# Patient Record
Sex: Male | Born: 1960 | Race: White | Hispanic: No | Marital: Married | Smoking: Former smoker
Health system: Southern US, Community
[De-identification: ages and names within clinical notes are randomized; demographics above are authoritative.]

## PROBLEM LIST (undated history)

## (undated) DIAGNOSIS — F32A Depression, unspecified: Secondary | ICD-10-CM

## (undated) DIAGNOSIS — F329 Major depressive disorder, single episode, unspecified: Secondary | ICD-10-CM

## (undated) HISTORY — DX: Depression, unspecified: F32.A

## (undated) HISTORY — PX: NASAL SEPTUM SURGERY: SHX37

## (undated) HISTORY — DX: Major depressive disorder, single episode, unspecified: F32.9

## (undated) HISTORY — PX: BACK SURGERY: SHX140

## (undated) HISTORY — PX: TONSILLECTOMY: SUR1361

---

## 2006-06-28 ENCOUNTER — Other Ambulatory Visit: Payer: Self-pay

## 2006-06-28 ENCOUNTER — Emergency Department: Payer: Self-pay | Admitting: Emergency Medicine

## 2009-04-08 ENCOUNTER — Ambulatory Visit: Payer: Self-pay | Admitting: Family Medicine

## 2009-06-12 ENCOUNTER — Ambulatory Visit: Payer: Self-pay | Admitting: Family Medicine

## 2010-03-02 ENCOUNTER — Emergency Department: Payer: Self-pay | Admitting: Emergency Medicine

## 2011-12-21 ENCOUNTER — Ambulatory Visit: Payer: Self-pay | Admitting: Emergency Medicine

## 2013-12-19 ENCOUNTER — Ambulatory Visit: Payer: Self-pay | Admitting: Gastroenterology

## 2014-01-24 ENCOUNTER — Ambulatory Visit: Payer: Self-pay | Admitting: Physician Assistant

## 2014-05-25 HISTORY — PX: COLONOSCOPY: SHX174

## 2015-03-28 ENCOUNTER — Other Ambulatory Visit: Payer: Self-pay

## 2015-04-25 ENCOUNTER — Encounter: Payer: Self-pay | Admitting: Surgery

## 2015-04-25 NOTE — Telephone Encounter (Signed)
error 

## 2015-04-30 ENCOUNTER — Ambulatory Visit (INDEPENDENT_AMBULATORY_CARE_PROVIDER_SITE_OTHER): Payer: 59 | Admitting: Family Medicine

## 2015-04-30 ENCOUNTER — Ambulatory Visit
Admission: RE | Admit: 2015-04-30 | Discharge: 2015-04-30 | Disposition: A | Discharge status: Home / Self Care | Payer: 59 | Source: Ambulatory Visit | Attending: Family Medicine | Admitting: Family Medicine

## 2015-04-30 ENCOUNTER — Encounter: Payer: Self-pay | Admitting: Family Medicine

## 2015-04-30 VITALS — BP 100/82 | HR 80

## 2015-04-30 DIAGNOSIS — M5137 Other intervertebral disc degeneration, lumbosacral region: Secondary | ICD-10-CM | POA: Diagnosis not present

## 2015-04-30 DIAGNOSIS — F3341 Major depressive disorder, recurrent, in partial remission: Secondary | ICD-10-CM | POA: Diagnosis not present

## 2015-04-30 DIAGNOSIS — M545 Low back pain: Secondary | ICD-10-CM | POA: Diagnosis present

## 2015-04-30 MED ORDER — ESCITALOPRAM OXALATE 20 MG PO TABS: 20.0000 mg | ORAL_TABLET | Freq: Every day | ORAL | Status: DC

## 2015-04-30 MED ORDER — PREDNISONE 10 MG PO TABS: 10.0000 mg | ORAL_TABLET | Freq: Every day | ORAL | Status: DC

## 2015-04-30 MED ORDER — HYDROCODONE-ACETAMINOPHEN 10-325 MG PO TABS: 1.0000 | ORAL_TABLET | Freq: Three times a day (TID) | ORAL | Status: DC | PRN

## 2015-04-30 NOTE — Progress Notes (Signed)
Name: Derek Blake   MRN: [id number]    DOB: [birth year]   Date:04/30/2015       Progress Note  Subjective  Chief Complaint  Chief Complaint  Patient presents with  . Depression    needs refill on Lexapro  . Back Pain    nauseated from pain- in center of back radiating down both sides of buttocks    Depression      The patient presents with depression.  This is a chronic problem.  The current episode started more than 1 year ago.   The onset quality is gradual.   The problem occurs intermittently.  The problem has been gradually improving since onset.  Associated symptoms include no decreased concentration, no fatigue, no helplessness, no hopelessness, does not have insomnia, not irritable, no restlessness, no decreased interest, no appetite change, no body aches, no myalgias, no headaches, no indigestion, not sad and no suicidal ideas.     The symptoms are aggravated by nothing.  Past treatments include SSRIs - Selective serotonin reuptake inhibitors.  Compliance with treatment is variable.  Previous treatment provided moderate relief.  Past medical history includes depression.     Pertinent negatives include no chronic fatigue syndrome, no chronic pain, no chronic illness and no obsessive-compulsive disorder. Back Pain This is a new problem. The current episode started 1 to 4 weeks ago (3 wks). The problem occurs daily. The problem has been gradually worsening since onset. Pertinent negatives include no abdominal pain, chest pain, dysuria, fever, headaches, tingling or weight loss. He has tried NSAIDs for the symptoms. The treatment provided mild relief.    No problem-specific assessment & plan notes found for this encounter.   Past Medical History  Diagnosis Date  . Depression     Past Surgical History  Procedure Laterality Date  . Nasal septum surgery    . Back surgery    . Tonsillectomy    . Colonoscopy  2016    cleared for 10 yrs- Dr Bluford Kaufmannh    Family History  Problem  Relation Age of Onset  . Cancer Mother     Social History   Social History  . Marital Status: Married    Spouse Name: N/A  . Number of Children: N/A  . Years of Education: N/A   Occupational History  . Not on file.   Social History Main Topics  . Smoking status: Former Games developermoker  . Smokeless tobacco: Not on file  . Alcohol Use: 0.0 oz/week    0 Standard drinks or equivalent per week  . Drug Use: No  . Sexual Activity: Yes   Other Topics Concern  . Not on file   Social History Narrative  . No narrative on file    No Known Allergies   Review of Systems  Constitutional: Negative for fever, chills, weight loss, malaise/fatigue, appetite change and fatigue.  HENT: Negative for ear discharge, ear pain and sore throat.   Eyes: Negative for blurred vision.  Respiratory: Negative for cough, sputum production, shortness of breath and wheezing.   Cardiovascular: Negative for chest pain, palpitations and leg swelling.  Gastrointestinal: Negative for heartburn, nausea, abdominal pain, diarrhea, constipation, blood in stool and melena.  Genitourinary: Negative for dysuria, urgency, frequency and hematuria.  Musculoskeletal: Positive for back pain. Negative for myalgias, joint pain and neck pain.  Skin: Negative for rash.  Neurological: Negative for dizziness, tingling, sensory change, focal weakness and headaches.  Endo/Heme/Allergies: Negative for environmental allergies and polydipsia. Does not bruise/bleed easily.  Psychiatric/Behavioral: Positive for depression. Negative for suicidal ideas and decreased concentration. The patient is not nervous/anxious and does not have insomnia.      Objective  Filed Vitals:   04/30/15 1518  BP: 100/82  Pulse: 80    Physical Exam  Constitutional: He is oriented to person, place, and time and well-developed, well-nourished, and in no distress. He is not irritable.  HENT:  Head: Normocephalic.  Right Ear: External ear normal.  Left  Ear: External ear normal.  Nose: Nose normal.  Mouth/Throat: Oropharynx is clear and moist.  Eyes: Conjunctivae and EOM are normal. Pupils are equal, round, and reactive to light. Right eye exhibits no discharge. Left eye exhibits no discharge. No scleral icterus.  Neck: Normal range of motion. Neck supple. No JVD present. No tracheal deviation present. No thyromegaly present.  Cardiovascular: Normal rate, regular rhythm, normal heart sounds and intact distal pulses.  Exam reveals no gallop and no friction rub.   No murmur heard. Pulmonary/Chest: Breath sounds normal. No respiratory distress. He has no wheezes. He has no rales.  Abdominal: Soft. Bowel sounds are normal. He exhibits no mass. There is no hepatosplenomegaly. There is no tenderness. There is no rebound, no guarding and no CVA tenderness.  Musculoskeletal: Normal range of motion. He exhibits no edema or tenderness.  Lymphadenopathy:    He has no cervical adenopathy.  Neurological: He is alert and oriented to person, place, and time. He has normal motor skills, normal sensation, normal strength, normal reflexes and intact cranial nerves. No cranial nerve deficit. He has a normal Straight Leg Raise Test.  Skin: Skin is warm. No rash noted.  Psychiatric: Mood and affect normal.  Nursing note and vitals reviewed.     Assessment & Plan  Problem List Items Addressed This Visit    None    Visit Diagnoses    Recurrent major depressive disorder, in partial remission (HCC)    -  Primary    Relevant Medications    escitalopram (LEXAPRO) 20 MG tablet    Disc degeneration, lumbosacral        Relevant Medications    predniSONE (DELTASONE) 10 MG tablet    HYDROcodone-acetaminophen (NORCO) 10-325 MG tablet    Other Relevant Orders    DG Lumbar Spine Complete         Dr. Hayden [facility]  04/30/2015

## 2015-04-30 NOTE — Patient Instructions (Signed)
Low Back Pain: Brief Version   What is low back pain? How does it happen?  Low back pain is pain or stiffness in the lower back. Most of the time, it is caused when a muscle in your back is strained. For example, it can happen when you lift a heavy object or when you sit or stand for a long time. Health problems, such as arthritis, can also cause back pain. Low back pain may last a day or two, several weeks, or longer. You may have pain in one spot or it may spread down the buttocks and into your legs. You should see your health care provider right away if you have back pain with these symptoms:   You cannot control your bladder or bowels.  You have a hard time moving your legs or walking.  Your arms or legs are numb or tingling.  These symptoms may mean you have hurt your spine and nerves. When you see your health care provider, he or she will:   Ask about your symptoms.  Give you an exam. X-rays or other tests may also be done.  How is it treated?  Here are some good ideas for taking care of low back pain:   Use an electric heating pad on a low setting (or a hot water bottle wrapped in a towel) for 20 to 30 minutes. (Don't let the heating pad get too hot, and don't fall asleep with it. You could get a burn.)  Use an ice pack wrapped in a towel for 20 minutes, one to four times a day. (Don't leave it on too long. You could get frostbite. Set an alarm to remind you.)  Take aspirin, ibuprofen, or other pain medicines your health care provider may suggest.  Ask about exercises you can do to stretch and strengthen your back muscles.  When you sleep or lie down, keep these hints in mind:   Rest on a firm mattress. It may help to lie on your back with your knees raised or lie on your side with your knees bent.  Put a pillow under your knees when you are lying down.  Sleep without a pillow under your head. Talk to your health care provider about whether it would help to:  Wear a  belt or corset to support your back.  Make visits to a physical therapist for traction.  Have your back massaged by a trained person. Take it easy at first. As you start to feel better, you'll be able to do more and more. But be careful. You may need to cut back on what you do:  If your symptoms come back.  If you have more pain after you start doing more or something new.  See your health care provider if your pain is worse even with treatment. How can I take care of myself?  You can lower the strain on your back. Here are some ideas that can help:   Try to get to and keep a healthy weight.  Use good posture. Stand with your head up, shoulders straight, chest forward, your weight on both feet, and your pelvis tucked in.  Sit in a straight-backed chair and hold your spine against the back of the chair.  Sit close to the pedals when you drive. Use your seat belt and a hard backrest or pillow.  Use a footrest for one foot when you stand or sit in one spot for a long time. This keeps your back straight.    Bend your knees when you bend over.  Here are tips when you need to lift or move heavy objects:   Don't push with your arms when you move a heavy object. Turn around and push backwards so your legs take the strain.  Bend your knees and hips and keep your back straight when you lift a heavy object.  Don't lift heavy objects higher than your waist.  Hold packages you carry close to your body, with your arms bent. To rest your back, do these exercises for 5 minutes or longer:  Lie on your back, bend your knees, and put pillows under your knees.  Lie on your back, put a pillow under your neck, bend your knees to a 90- degree angle, and put your lower legs and feet on a chair.  Lie on your back, bend your knees, and bring one knee up to your chest and hold it there. Repeat with the other knee, then bring both knees to your chest. When holding your knee to your chest, grab your  thigh rather than your lower leg.  

## 2015-05-21 ENCOUNTER — Encounter: Payer: Self-pay | Admitting: Emergency Medicine

## 2015-05-21 ENCOUNTER — Ambulatory Visit (INDEPENDENT_AMBULATORY_CARE_PROVIDER_SITE_OTHER): Payer: 59 | Admitting: Family Medicine

## 2015-05-21 ENCOUNTER — Encounter: Payer: Self-pay | Admitting: Family Medicine

## 2015-05-21 ENCOUNTER — Emergency Department
Admission: EM | Admit: 2015-05-21 | Discharge: 2015-05-21 | Disposition: A | Discharge status: Home / Self Care | Payer: 59 | Attending: Emergency Medicine | Admitting: Emergency Medicine

## 2015-05-21 VITALS — BP 130/80 | HR 80 | Ht 70.0 in | Wt 206.0 lb

## 2015-05-21 DIAGNOSIS — M5126 Other intervertebral disc displacement, lumbar region: Secondary | ICD-10-CM | POA: Diagnosis not present

## 2015-05-21 DIAGNOSIS — M5416 Radiculopathy, lumbar region: Secondary | ICD-10-CM | POA: Diagnosis not present

## 2015-05-21 DIAGNOSIS — M545 Low back pain: Secondary | ICD-10-CM | POA: Diagnosis present

## 2015-05-21 DIAGNOSIS — Z79899 Other long term (current) drug therapy: Secondary | ICD-10-CM | POA: Diagnosis not present

## 2015-05-21 DIAGNOSIS — Z87891 Personal history of nicotine dependence: Secondary | ICD-10-CM | POA: Diagnosis not present

## 2015-05-21 DIAGNOSIS — R110 Nausea: Secondary | ICD-10-CM | POA: Diagnosis not present

## 2015-05-21 DIAGNOSIS — Z7952 Long term (current) use of systemic steroids: Secondary | ICD-10-CM | POA: Insufficient documentation

## 2015-05-21 MED ORDER — HYDROMORPHONE HCL 1 MG/ML IJ SOLN
1.0000 mg | Freq: Once | INTRAMUSCULAR | Status: AC
  Administered 2015-05-21: 1 mg via INTRAMUSCULAR
  Filled 2015-05-21: qty 1

## 2015-05-21 MED ORDER — DEXAMETHASONE SODIUM PHOSPHATE 10 MG/ML IJ SOLN
10.0000 mg | Freq: Once | INTRAMUSCULAR | Status: AC
  Administered 2015-05-21: 10 mg via INTRAMUSCULAR
  Filled 2015-05-21: qty 1

## 2015-05-21 MED ORDER — METHOCARBAMOL 500 MG PO TABS
1000.0000 mg | ORAL_TABLET | Freq: Once | ORAL | Status: AC
  Administered 2015-05-21: 1000 mg via ORAL
  Filled 2015-05-21: qty 2

## 2015-05-21 MED ORDER — OXYCODONE-ACETAMINOPHEN 10-325 MG PO TABS: 1.0000 | ORAL_TABLET | Freq: Four times a day (QID) | ORAL | Status: DC | PRN

## 2015-05-21 MED ORDER — PROMETHAZINE HCL 25 MG PO TABS: 25.0000 mg | ORAL_TABLET | Freq: Three times a day (TID) | ORAL | Status: DC | PRN

## 2015-05-21 MED ORDER — METHOCARBAMOL 750 MG PO TABS: 1500.0000 mg | ORAL_TABLET | Freq: Four times a day (QID) | ORAL | Status: DC

## 2015-05-21 NOTE — ED Notes (Signed)
Pt discharged home after verbalizing understanding of discharge instructions; nad noted. 

## 2015-05-21 NOTE — ED Notes (Signed)
Pt reports back pain that has not been getting better. Dr. Yetta BarreJones (PCP) states that she is out of options for treatment and sent him here. Pt has numbness/pain in right leg and his foot "flops" when he tries to move it. He was treated with prednisone and pain medication x 2 weeks, and it has gotten steadily worse. Pt reports pain is so bad it makes him nauseated.

## 2015-05-21 NOTE — ED Notes (Signed)
Lower back pain since Nov. Recently placed on prednsone  conts to have   Pain radiates into right leg no loss of bowel or bladder

## 2015-05-21 NOTE — Discharge Instructions (Signed)
Radicular Pain °Radicular pain in either the arm or leg is usually from a bulging or herniated disk in the spine. A piece of the herniated disk may press against the nerves as the nerves exit the spine. This causes pain which is felt at the tips of the nerves down the arm or leg. Other causes of radicular pain may include: °· Fractures. °· Heart disease. °· Cancer. °· An abnormal and usually degenerative state of the nervous system or nerves (neuropathy). °Diagnosis may require CT or MRI scanning to determine the primary cause.  °Nerves that start at the neck (nerve roots) may cause radicular pain in the outer shoulder and arm. It can spread down to the thumb and fingers. The symptoms vary depending on which nerve root has been affected. In most cases radicular pain improves with conservative treatment. Neck problems may require physical therapy, a neck collar, or cervical traction. Treatment may take many weeks, and surgery may be considered if the symptoms do not improve.  °Conservative treatment is also recommended for sciatica. Sciatica causes pain to radiate from the lower back or buttock area down the leg into the foot. Often there is a history of back problems. Most patients with sciatica are better after 2 to 4 weeks of rest and other supportive care. Short term bed rest can reduce the disk pressure considerably. Sitting, however, is not a good position since this increases the pressure on the disk. You should avoid bending, lifting, and all other activities which make the problem worse. Traction can be used in severe cases. Surgery is usually reserved for patients who do not improve within the first months of treatment. °Only take over-the-counter or prescription medicines for pain, discomfort, or fever as directed by your caregiver. Narcotics and muscle relaxants may help by relieving more severe pain and spasm and by providing mild sedation. Cold or massage can give significant relief. Spinal manipulation  is not recommended. It can increase the degree of disc protrusion. Epidural steroid injections are often effective treatment for radicular pain. These injections deliver medicine to the spinal nerve in the space between the protective covering of the spinal cord and back bones (vertebrae). Your caregiver can give you more information about steroid injections. These injections are most effective when given within two weeks of the onset of pain.  °You should see your caregiver for follow up care as recommended. A program for neck and back injury rehabilitation with stretching and strengthening exercises is an important part of management.  °SEEK IMMEDIATE MEDICAL CARE IF: °· You develop increased pain, weakness, or numbness in your arm or leg. °· You develop difficulty with bladder or bowel control. °· You develop abdominal pain. °  °This information is not intended to replace advice given to you by your health care provider. Make sure you discuss any questions you have with your health care provider. °  °Document Released: 06/18/2004 Document Revised: 06/01/2014 Document Reviewed: 12/05/2014 °Elsevier Interactive Patient Education ©2016 [employer] ° °

## 2015-05-21 NOTE — ED Provider Notes (Signed)
[facility] Emergency Department Provider Note ____________________________________________  Time seen: Approximately 11:45 AM  I have reviewed the triage vital signs and the nursing notes.   HISTORY  Chief Complaint Back Pain    HPI Derek Blake is a 54 y.o. male patient complain of right radicular back pain for greater than a month. Patient state he is followed by his family doctor for the past month and was told there is no other options that he can treat and was sent to the emergency room for MRI. Patient complaining any numbness and intermittent pain to his right leg with occasional foot "Flop". Patient recently completed a course of prednisone and pain medications last night. Patient went to his family doctor today and was sent to the emergency room. Patient denies any bladder or bowel dysfunction. Patient rates his pain as 8/10.   Past Medical History  Diagnosis Date  . Depression     There are no active problems to display for this patient.   Past Surgical History  Procedure Laterality Date  . Nasal septum surgery    . Back surgery    . Tonsillectomy    . Colonoscopy  2016    cleared for 10 yrs- Dr Bluford Kaufmann    Current Outpatient Rx  Name  Route  Sig  Dispense  Refill  . escitalopram (LEXAPRO) 20 MG tablet   Oral   Take 1 tablet (20 mg total) by mouth daily.   30 tablet   6   . HYDROcodone-acetaminophen (NORCO) 10-325 MG tablet   Oral   Take 1 tablet by mouth every 8 (eight) hours as needed. Patient not taking: Reported on 05/21/2015   30 tablet   0   . methocarbamol (ROBAXIN-750) 750 MG tablet   Oral   Take 2 tablets (1,500 mg total) by mouth 4 (four) times daily.   40 tablet   0   . oxyCODONE-acetaminophen (PERCOCET) 10-325 MG tablet   Oral   Take 1 tablet by mouth every 6 (six) hours as needed for pain.   20 tablet   0   . predniSONE (DELTASONE) 10 MG tablet   Oral   Take 1 tablet (10 mg total) by mouth daily with  breakfast. Patient not taking: Reported on 05/21/2015   14 tablet   0   . promethazine (PHENERGAN) 25 MG tablet   Oral   Take 1 tablet (25 mg total) by mouth every 8 (eight) hours as needed for nausea or vomiting.   20 tablet   0     Allergies Review of patient's allergies indicates no known allergies.  Family History  Problem Relation Age of Onset  . Cancer Mother     Social History Social History  Substance Use Topics  . Smoking status: Former Games developer  . Smokeless tobacco: None  . Alcohol Use: 0.0 oz/week    0 Standard drinks or equivalent per week    Review of Systems Constitutional: No fever/chills Eyes: No visual changes. ENT: No sore throat. Cardiovascular: Denies chest pain. Respiratory: Denies shortness of breath. Gastrointestinal: No abdominal pain.  No nausea, no vomiting.  No diarrhea.  No constipation. Genitourinary: Negative for dysuria. Musculoskeletal: Negative for back pain. Skin: Negative for rash. Neurological: Negative for headaches, focal weakness or numbness. 10-point ROS otherwise negative.  ____________________________________________   PHYSICAL EXAM:  VITAL SIGNS: ED Triage Vitals  Enc Vitals Group     BP 05/21/15 1112 125/74 mmHg     Pulse Rate 05/21/15 1112 85  Resp 05/21/15 1112 20     Temp 05/21/15 1112 98.4 F (36.9 C)     Temp Source 05/21/15 1112 Oral     SpO2 05/21/15 1112 99 %     Weight 05/21/15 1112 206 lb (93.441 kg)     Height 05/21/15 1112 5\' 10"  (1.778 m)     Head Cir --      Peak Flow --      Pain Score 05/21/15 1111 8     Pain Loc --      Pain Edu? --      Excl. in GC? --     Constitutional: Alert and oriented. Well appearing and in no acute distress. Eyes: Conjunctivae are normal. PERRL. EOMI. Head: Atraumatic. Nose: No congestion/rhinnorhea. Mouth/Throat: Mucous membranes are moist.  Oropharynx non-erythematous. Neck: No stridor.  No cervical spine tenderness to  palpation. Hematological/Lymphatic/Immunilogical: No cervical lymphadenopathy. Cardiovascular: Normal rate, regular rhythm. Grossly normal heart sounds.  Good peripheral circulation. Respiratory: Normal respiratory effort.  No retractions. Lungs CTAB. Gastrointestinal: Soft and nontender. No distention. No abdominal bruits. No CVA tenderness. Musculoskeletal: The patient ambulate for atypical gait favoring the right lower extremity. Examination of the back shows no obvious deformity. There is a surgical scar consistent patient past medical history. Patient has decreased range of motion's all fields. Moderate muscle spasms on the right side with extension. Patient had negative straight leg test bilaterally.  Neurologic:  Normal speech and language. No gross focal neurologic deficits are appreciated. No gait instability. Skin:  Skin is warm, dry and intact. No rash noted. Psychiatric: Mood and affect are normal. Speech and behavior are normal.  ____________________________________________   LABS (all labs ordered are listed, but only abnormal results are displayed)  Labs Reviewed - No data to display ____________________________________________  EKG   ____________________________________________  RADIOLOGY  Reviewed previous x-ray that was taken on 05/01/2015. Findings were shown very mild edematous change and felt this height loss centered at L3 and 4 and L4 and 5 there is also a facet joint degenerative changes at L4 and 5 and L5-S1. ____________________________________________   PROCEDURES  Procedure(s) performed: None  Critical Care performed: No  ____________________________________________   INITIAL IMPRESSION / ASSESSMENT AND PLAN / ED COURSE  Pertinent labs & imaging results that were available during my care of the patient were reviewed by me and considered in my medical decision making (see chart for details).  Radicular right back pain. Patient given discharge care  instructions. Advised patient to contact pain management clinic for definitive evaluation and treatment. ____________________________________________   FINAL CLINICAL IMPRESSION(S) / ED DIAGNOSES  Final diagnoses:  Acute radicular low back pain      Joni ReiningRonald K Twylah Bennetts, PA-C 05/21/15 1155  Emily FilbertJonathan E Williams, MD 05/21/15 1351

## 2015-05-21 NOTE — Progress Notes (Signed)
Name: Derek Blake   MRN: [id number]    DOB: 02/20/1961   Date:05/21/2015       Progress Note  Subjective  Chief Complaint  Chief Complaint  Patient presents with  . Back Pain    pain has gotten worse    Back Pain This is a recurrent problem. The current episode started 1 to 4 weeks ago. The problem occurs intermittently. The problem has been gradually worsening since onset. The pain is present in the lumbar spine. The quality of the pain is described as aching. Associated symptoms include leg pain, numbness, paresthesias, tingling and weakness. Pertinent negatives include no abdominal pain, bladder incontinence, bowel incontinence, chest pain, dysuria, fever, headaches, paresis, pelvic pain, perianal numbness or weight loss. Risk factors: previous. He has tried analgesics for the symptoms. The treatment provided no relief.    No problem-specific assessment & plan notes found for this encounter.   Past Medical History  Diagnosis Date  . Depression     Past Surgical History  Procedure Laterality Date  . Nasal septum surgery    . Back surgery    . Tonsillectomy    . Colonoscopy  2016    cleared for 10 yrs- Dr Bluford Kaufmann    Family History  Problem Relation Age of Onset  . Cancer Mother     Social History   Social History  . Marital Status: Married    Spouse Name: N/A  . Number of Children: N/A  . Years of Education: N/A   Occupational History  . Not on file.   Social History Main Topics  . Smoking status: Former Games developer  . Smokeless tobacco: Not on file  . Alcohol Use: 0.0 oz/week    0 Standard drinks or equivalent per week  . Drug Use: No  . Sexual Activity: Yes   Other Topics Concern  . Not on file   Social History Narrative    No Known Allergies   Review of Systems  Constitutional: Negative for fever, chills, weight loss and malaise/fatigue.  HENT: Negative for ear discharge, ear pain and sore throat.   Eyes: Negative for blurred vision.  Respiratory:  Negative for cough, sputum production, shortness of breath and wheezing.   Cardiovascular: Negative for chest pain, palpitations and leg swelling.  Gastrointestinal: Positive for nausea. Negative for heartburn, abdominal pain, diarrhea, constipation, blood in stool, melena and bowel incontinence.  Genitourinary: Negative for bladder incontinence, dysuria, urgency, frequency, hematuria and pelvic pain.  Musculoskeletal: Positive for back pain. Negative for myalgias, joint pain and neck pain.  Skin: Negative for rash.  Neurological: Positive for tingling, weakness, numbness and paresthesias. Negative for dizziness, sensory change, focal weakness and headaches.  Endo/Heme/Allergies: Negative for environmental allergies and polydipsia. Does not bruise/bleed easily.  Psychiatric/Behavioral: Negative for depression and suicidal ideas. The patient is not nervous/anxious and does not have insomnia.      Objective  Filed Vitals:   05/21/15 0819  BP: 130/80  Pulse: 80  Height:  (1.778 m)  Weight: 206 lb (93.441 kg)    Physical Exam  Constitutional: He is oriented to person, place, and time and well-developed, well-nourished, and in no distress.  HENT:  Head: Normocephalic.  Right Ear: External ear normal.  Left Ear: External ear normal.  Nose: Nose normal.  Mouth/Throat: Oropharynx is clear and moist.  Eyes: Conjunctivae and EOM are normal. Pupils are equal, round, and reactive to light. Right eye exhibits no discharge. Left eye exhibits no discharge. No scleral icterus.  Neck:  Normal range of motion. Neck supple. No JVD present. No tracheal deviation present. No thyromegaly present.  Cardiovascular: Normal rate, regular rhythm, normal heart sounds and intact distal pulses.  Exam reveals no gallop and no friction rub.   No murmur heard. Pulmonary/Chest: Breath sounds normal. No respiratory distress. He has no wheezes. He has no rales.  Abdominal: Soft. Bowel sounds are normal. He  exhibits no mass. There is no hepatosplenomegaly. There is no tenderness. There is no rebound, no guarding and no CVA tenderness.  Musculoskeletal: Normal range of motion. He exhibits no edema or tenderness.  Lymphadenopathy:    He has no cervical adenopathy.  Neurological: He is alert and oriented to person, place, and time. He has normal sensation, normal reflexes and intact cranial nerves. He displays weakness. He displays normal reflexes. No cranial nerve deficit. He has an abnormal Straight Leg Raise Test.  Right dorsiflexion 4/5  Skin: Skin is warm. No rash noted.  Psychiatric: Mood and affect normal.      Assessment & Plan  Problem List Items Addressed This Visit    None    Visit Diagnoses    Lumbar herniated disc    -  Primary    [facility] /set up with Dr Clotilde Dietererian    Nausea        Relevant Medications    promethazine (PHENERGAN) 25 MG tablet         Dr. Hayden Rasmusseneanna Hinton Luellen [facility]  05/21/2015

## 2015-05-22 ENCOUNTER — Other Ambulatory Visit: Payer: Self-pay | Admitting: Family Medicine

## 2015-05-31 ENCOUNTER — Other Ambulatory Visit: Payer: Self-pay

## 2015-05-31 DIAGNOSIS — M5137 Other intervertebral disc degeneration, lumbosacral region: Secondary | ICD-10-CM

## 2015-05-31 MED ORDER — HYDROCODONE-ACETAMINOPHEN 10-325 MG PO TABS: 1.0000 | ORAL_TABLET | Freq: Three times a day (TID) | ORAL | Status: DC | PRN

## 2015-06-17 ENCOUNTER — Other Ambulatory Visit: Payer: Self-pay

## 2015-07-04 DIAGNOSIS — M5416 Radiculopathy, lumbar region: Secondary | ICD-10-CM | POA: Insufficient documentation

## 2015-07-12 DIAGNOSIS — M5432 Sciatica, left side: Secondary | ICD-10-CM | POA: Insufficient documentation

## 2015-07-12 DIAGNOSIS — M48062 Spinal stenosis, lumbar region with neurogenic claudication: Secondary | ICD-10-CM | POA: Insufficient documentation

## 2015-07-12 DIAGNOSIS — M5431 Sciatica, right side: Secondary | ICD-10-CM | POA: Insufficient documentation

## 2015-07-12 DIAGNOSIS — Z9889 Other specified postprocedural states: Secondary | ICD-10-CM | POA: Insufficient documentation

## 2015-07-12 DIAGNOSIS — F419 Anxiety disorder, unspecified: Secondary | ICD-10-CM | POA: Insufficient documentation

## 2015-11-29 ENCOUNTER — Ambulatory Visit (INDEPENDENT_AMBULATORY_CARE_PROVIDER_SITE_OTHER): Payer: 59 | Admitting: Family Medicine

## 2015-11-29 ENCOUNTER — Encounter: Payer: Self-pay | Admitting: Family Medicine

## 2015-11-29 VITALS — BP 130/80 | HR 80 | Ht 70.0 in | Wt 203.0 lb

## 2015-11-29 DIAGNOSIS — T148 Other injury of unspecified body region: Secondary | ICD-10-CM | POA: Diagnosis not present

## 2015-11-29 DIAGNOSIS — L03115 Cellulitis of right lower limb: Secondary | ICD-10-CM | POA: Diagnosis not present

## 2015-11-29 DIAGNOSIS — W57XXXA Bitten or stung by nonvenomous insect and other nonvenomous arthropods, initial encounter: Secondary | ICD-10-CM | POA: Diagnosis not present

## 2015-11-29 MED ORDER — SULFAMETHOXAZOLE-TRIMETHOPRIM 800-160 MG PO TABS: 1.0000 | ORAL_TABLET | Freq: Two times a day (BID) | ORAL | Status: DC

## 2015-11-29 MED ORDER — MUPIROCIN 2 % EX OINT: 1.0000 "application " | TOPICAL_OINTMENT | Freq: Two times a day (BID) | CUTANEOUS | Status: DC

## 2015-11-29 NOTE — Progress Notes (Signed)
Name: Derek Blake   MRN: [id number]    DOB: January 27, 1961   Date:11/29/2015       Progress Note  Subjective  Chief Complaint  Chief Complaint  Patient presents with  . Insect Bite    x 2 weeks- was only red but after getting in pool and swimming- looks bumpy, red and hole is bigger    Rash This is a new problem. The current episode started 1 to 4 weeks ago. The problem has been gradually worsening since onset. The affected locations include the right lowerleg. The rash is characterized by redness and swelling. He was exposed to an insect bite/sting. Pertinent negatives include no anorexia, congestion, cough, diarrhea, eye pain, facial edema, fatigue, fever, joint pain, nail changes, rhinorrhea, shortness of breath, sore throat or vomiting. Past treatments include nothing. The treatment provided mild relief.    No problem-specific assessment & plan notes found for this encounter.   Past Medical History  Diagnosis Date  . Depression     Past Surgical History  Procedure Laterality Date  . Nasal septum surgery    . Back surgery    . Tonsillectomy    . Colonoscopy  2016    cleared for 10 yrs- Dr Bluford Kaufmannh    Family History  Problem Relation Age of Onset  . Cancer Mother     Social History   Social History  . Marital Status: Married    Spouse Name: N/A  . Number of Children: N/A  . Years of Education: N/A   Occupational History  . Not on file.   Social History Main Topics  . Smoking status: Former Games developermoker  . Smokeless tobacco: Not on file  . Alcohol Use: 0.0 oz/week    0 Standard drinks or equivalent per week  . Drug Use: No  . Sexual Activity: Yes   Other Topics Concern  . Not on file   Social History Narrative    No Known Allergies   Review of Systems  Constitutional: Negative for fever, chills, weight loss, malaise/fatigue and fatigue.  HENT: Negative for congestion, ear discharge, ear pain, rhinorrhea and sore throat.   Eyes: Negative for blurred vision and  pain.  Respiratory: Negative for cough, sputum production, shortness of breath and wheezing.   Cardiovascular: Negative for chest pain, palpitations and leg swelling.  Gastrointestinal: Negative for heartburn, nausea, vomiting, abdominal pain, diarrhea, constipation, blood in stool, melena and anorexia.  Genitourinary: Negative for dysuria, urgency, frequency and hematuria.  Musculoskeletal: Negative for myalgias, back pain, joint pain and neck pain.  Skin: Positive for rash. Negative for nail changes.  Neurological: Negative for dizziness, tingling, sensory change, focal weakness and headaches.  Endo/Heme/Allergies: Negative for environmental allergies and polydipsia. Does not bruise/bleed easily.  Psychiatric/Behavioral: Negative for depression and suicidal ideas. The patient is not nervous/anxious and does not have insomnia.      Objective  Filed Vitals:   11/29/15 1449  BP: 130/80  Pulse: 80  Height: 5\' 10"  (1.778 m)  Weight: 203 lb (92.08 kg)    Physical Exam  Constitutional: He is oriented to person, place, and time and well-developed, well-nourished, and in no distress.  HENT:  Head: Normocephalic.  Right Ear: External ear normal.  Left Ear: External ear normal.  Nose: Nose normal.  Mouth/Throat: Oropharynx is clear and moist.  Eyes: Conjunctivae and EOM are normal. Pupils are equal, round, and reactive to light. Right eye exhibits no discharge. Left eye exhibits no discharge. No scleral icterus.  Neck: Normal range  of motion. Neck supple. No JVD present. No tracheal deviation present. No thyromegaly present.  Cardiovascular: Normal rate, regular rhythm, normal heart sounds and intact distal pulses.  Exam reveals no gallop and no friction rub.   No murmur heard. Pulmonary/Chest: Breath sounds normal. No respiratory distress. He has no wheezes. He has no rales.  Abdominal: Soft. Bowel sounds are normal. He exhibits no mass. There is no hepatosplenomegaly. There is no  tenderness. There is no rebound, no guarding and no CVA tenderness.  Musculoskeletal: Normal range of motion. He exhibits no edema or tenderness.  Lymphadenopathy:    He has no cervical adenopathy.  Neurological: He is alert and oriented to person, place, and time. He has normal sensation, normal strength, normal reflexes and intact cranial nerves. No cranial nerve deficit.  Skin: Skin is warm. No rash noted.  Psychiatric: Mood and affect normal.  Nursing note and vitals reviewed.     Assessment & Plan  Problem List Items Addressed This Visit    None    Visit Diagnoses    Insect bite    -  Primary    Relevant Medications    mupirocin ointment (BACTROBAN) 2 %    sulfamethoxazole-trimethoprim (BACTRIM DS,SEPTRA DS) 800-160 MG tablet    Cellulitis of right lower extremity        Relevant Medications    mupirocin ointment (BACTROBAN) 2 %    sulfamethoxazole-trimethoprim (BACTRIM DS,SEPTRA DS) 800-160 MG tablet         Dr. Hayden Rasmusseneanna Jones [facility]  11/29/2015

## 2015-12-08 ENCOUNTER — Other Ambulatory Visit: Payer: Self-pay | Admitting: Family Medicine

## 2016-01-29 ENCOUNTER — Other Ambulatory Visit: Payer: Self-pay

## 2016-01-29 MED ORDER — ESCITALOPRAM OXALATE 20 MG PO TABS: 20.0000 mg | ORAL_TABLET | Freq: Every day | ORAL | 0 refills | Status: DC

## 2016-03-02 ENCOUNTER — Encounter: Payer: Self-pay | Admitting: Family Medicine

## 2016-03-02 ENCOUNTER — Ambulatory Visit (INDEPENDENT_AMBULATORY_CARE_PROVIDER_SITE_OTHER): Payer: Managed Care, Other (non HMO) | Admitting: Family Medicine

## 2016-03-02 VITALS — BP 130/80 | HR 80 | Ht 70.0 in | Wt 207.0 lb

## 2016-03-02 DIAGNOSIS — E663 Overweight: Secondary | ICD-10-CM

## 2016-03-02 DIAGNOSIS — F3342 Major depressive disorder, recurrent, in full remission: Secondary | ICD-10-CM

## 2016-03-02 MED ORDER — ESCITALOPRAM OXALATE 20 MG PO TABS: 20.0000 mg | ORAL_TABLET | Freq: Every day | ORAL | 6 refills | Status: DC

## 2016-03-02 NOTE — Progress Notes (Signed)
Name: Derek Blake   MRN: [id number]    DOB: 1961/02/14   Date:03/02/2016       Progress Note  Subjective  Chief Complaint  Chief Complaint  Patient presents with  . Depression    Depression       The patient presents with depression.  This is a recurrent problem.  The current episode started more than 1 year ago.   The onset quality is gradual.   The problem occurs daily.  The problem has been gradually improving since onset.  Associated symptoms include no fatigue, does not have insomnia, not irritable, no decreased interest, no myalgias, no headaches, not sad and no suicidal ideas.     The symptoms are aggravated by nothing.  Past treatments include SSRIs - Selective serotonin reuptake inhibitors.  Compliance with treatment is good.  Previous treatment provided mild relief.  Past medical history includes anxiety and depression.     No problem-specific Assessment & Plan notes found for this encounter.   Past Medical History:  Diagnosis Date  . Depression     Past Surgical History:  Procedure Laterality Date  . BACK SURGERY    . COLONOSCOPY  2016   cleared for 10 yrs- Dr Bluford Kaufmann  . NASAL SEPTUM SURGERY    . TONSILLECTOMY      Family History  Problem Relation Age of Onset  . Cancer Mother     Social History   Social History  . Marital status: Married    Spouse name: N/A  . Number of children: N/A  . Years of education: N/A   Occupational History  . Not on file.   Social History Main Topics  . Smoking status: Former Games developer  . Smokeless tobacco: Not on file  . Alcohol use 0.0 oz/week  . Drug use: No  . Sexual activity: Yes   Other Topics Concern  . Not on file   Social History Narrative  . No narrative on file    No Known Allergies   Review of Systems  Constitutional: Negative for chills, fatigue, fever, malaise/fatigue and weight loss.  HENT: Negative for ear discharge, ear pain and sore throat.   Eyes: Negative for blurred vision.  Respiratory:  Negative for cough, sputum production, shortness of breath and wheezing.   Cardiovascular: Negative for chest pain, palpitations and leg swelling.  Gastrointestinal: Negative for abdominal pain, blood in stool, constipation, diarrhea, heartburn, melena and nausea.  Genitourinary: Negative for dysuria, frequency, hematuria and urgency.  Musculoskeletal: Negative for back pain, joint pain, myalgias and neck pain.  Skin: Negative for rash.  Neurological: Negative for dizziness, tingling, sensory change, focal weakness and headaches.  Endo/Heme/Allergies: Negative for environmental allergies and polydipsia. Does not bruise/bleed easily.  Psychiatric/Behavioral: Positive for depression. Negative for suicidal ideas. The patient is not nervous/anxious and does not have insomnia.      Objective  Vitals:   03/02/16 0816  BP: 130/80  Pulse: 80  Weight: 207 lb (93.9 kg)  Height: 5\' 10"  (1.778 m)    Physical Exam  Constitutional: He is oriented to person, place, and time and well-developed, well-nourished, and in no distress. He is not irritable.  HENT:  Head: Normocephalic.  Right Ear: External ear normal.  Left Ear: External ear normal.  Nose: Nose normal.  Mouth/Throat: Oropharynx is clear and moist.  Eyes: Conjunctivae and EOM are normal. Pupils are equal, round, and reactive to light. Right eye exhibits no discharge. Left eye exhibits no discharge. No scleral icterus.  Neck: Normal  range of motion. Neck supple. No JVD present. No tracheal deviation present. No thyromegaly present.  Cardiovascular: Normal rate, regular rhythm, normal heart sounds and intact distal pulses.  Exam reveals no gallop and no friction rub.   No murmur heard. Pulmonary/Chest: Breath sounds normal. No respiratory distress. He has no wheezes. He has no rales.  Abdominal: Soft. Bowel sounds are normal. He exhibits no mass. There is no hepatosplenomegaly. There is no tenderness. There is no rebound, no guarding and no  CVA tenderness.  Musculoskeletal: Normal range of motion. He exhibits no edema or tenderness.  Lymphadenopathy:    He has no cervical adenopathy.  Neurological: He is alert and oriented to person, place, and time. He has normal sensation, normal strength, normal reflexes and intact cranial nerves. No cranial nerve deficit.  Skin: Skin is warm. No rash noted.  Psychiatric: Mood and affect normal.  Nursing note and vitals reviewed.     Assessment & Plan  Problem List Items Addressed This Visit      Other   Recurrent major depressive disorder, in full remission (HCC) - Primary   Relevant Medications   escitalopram (LEXAPRO) 20 MG tablet    Other Visit Diagnoses    Overweight       Relevant Orders   Lipid panel        Dr. Elizabeth Sauereanna Takaya [facility]  03/02/16

## 2016-03-03 ENCOUNTER — Other Ambulatory Visit: Payer: Self-pay

## 2016-03-03 LAB — LIPID PANEL
CHOLESTEROL TOTAL: 167 mg/dL (ref 100–199)
Chol/HDL Ratio: 5.2 ratio units — ABNORMAL HIGH (ref 0.0–5.0)
HDL: 32 mg/dL — AB (ref >39)
LDL CALC: 65 mg/dL (ref 0–99)
TRIGLYCERIDES: 351 mg/dL — AB (ref 0–149)
VLDL CHOLESTEROL CAL: 70 mg/dL — AB (ref 5–40)

## 2016-03-06 DIAGNOSIS — D62 Acute posthemorrhagic anemia: Secondary | ICD-10-CM | POA: Insufficient documentation

## 2016-06-04 ENCOUNTER — Other Ambulatory Visit: Payer: Self-pay

## 2016-08-19 ENCOUNTER — Ambulatory Visit (INDEPENDENT_AMBULATORY_CARE_PROVIDER_SITE_OTHER): Payer: Managed Care, Other (non HMO) | Admitting: Family Medicine

## 2016-08-19 VITALS — BP 140/80 | HR 84 | Ht 67.0 in | Wt 206.0 lb

## 2016-08-19 DIAGNOSIS — K2970 Gastritis, unspecified, without bleeding: Secondary | ICD-10-CM | POA: Diagnosis not present

## 2016-08-19 MED ORDER — PANTOPRAZOLE SODIUM 40 MG PO TBEC: 40.0000 mg | DELAYED_RELEASE_TABLET | Freq: Every day | ORAL | 3 refills | Status: DC

## 2016-08-19 MED ORDER — PROMETHAZINE HCL 25 MG PO TABS: 25.0000 mg | ORAL_TABLET | Freq: Three times a day (TID) | ORAL | 0 refills | Status: DC | PRN

## 2016-08-19 NOTE — Progress Notes (Signed)
Name: Derek Blake   MRN: [id number]    DOB: 04-04-1961   Date:08/19/2016       Progress Note  Subjective  Chief Complaint  Chief Complaint  Patient presents with  . Nausea    nauseated x 3 days- can't eat and "feel like I can throw up any minute"- pepto bismol helps some. No Hx of ulcers    Emesis   This is a recurrent (nausea) problem. The current episode started in the past 7 days (with occasional vomiting). The problem occurs more than 10 times per day. The problem has been waxing and waning. There has been no fever. Associated symptoms include abdominal pain. Pertinent negatives include no arthralgias, chest pain, chills, coughing, diarrhea, dizziness, fever, headaches, myalgias, sweats, URI or weight loss. He has tried nothing for the symptoms. The treatment provided mild relief.    No problem-specific Assessment & Plan notes found for this encounter.   Past Medical History:  Diagnosis Date  . Depression     Past Surgical History:  Procedure Laterality Date  . BACK SURGERY    . COLONOSCOPY  2016   cleared for 10 yrs- Dr Bluford Kaufmann  . NASAL SEPTUM SURGERY    . TONSILLECTOMY      Family History  Problem Relation Age of Onset  . Cancer Mother     Social History   Social History  . Marital status: Married    Spouse name: N/A  . Number of children: N/A  . Years of education: N/A   Occupational History  . Not on file.   Social History Main Topics  . Smoking status: Former Games developer  . Smokeless tobacco: Not on file  . Alcohol use 0.0 oz/week  . Drug use: No  . Sexual activity: Yes   Other Topics Concern  . Not on file   Social History Narrative  . No narrative on file    No Known Allergies  Outpatient Medications Prior to Visit  Medication Sig Dispense Refill  . escitalopram (LEXAPRO) 20 MG tablet Take 1 tablet (20 mg total) by mouth daily. 30 tablet 6  . mupirocin ointment (BACTROBAN) 2 % Place 1 application into the nose 2 (two) times daily. 22 g 0   No  facility-administered medications prior to visit.     Review of Systems  Constitutional: Negative for chills, fever, malaise/fatigue and weight loss.  HENT: Negative for ear discharge, ear pain and sore throat.   Eyes: Negative for blurred vision.  Respiratory: Negative for cough, sputum production, shortness of breath and wheezing.   Cardiovascular: Negative for chest pain, palpitations and leg swelling.  Gastrointestinal: Positive for abdominal pain and vomiting. Negative for blood in stool, constipation, diarrhea, heartburn, melena and nausea.  Genitourinary: Negative for dysuria, frequency, hematuria and urgency.  Musculoskeletal: Negative for arthralgias, back pain, joint pain, myalgias and neck pain.  Skin: Negative for rash.  Neurological: Negative for dizziness, tingling, sensory change, focal weakness and headaches.  Endo/Heme/Allergies: Negative for environmental allergies and polydipsia. Does not bruise/bleed easily.  Psychiatric/Behavioral: Negative for depression and suicidal ideas. The patient is not nervous/anxious and does not have insomnia.      Objective  Vitals:   08/19/16 0829  BP: 140/80  Pulse: 84  Weight: 206 lb (93.4 kg)  Height: 5\' 7"  (1.702 m)    Physical Exam  Constitutional: He is oriented to person, place, and time and well-developed, well-nourished, and in no distress.  HENT:  Head: Normocephalic.  Right Ear: External ear normal.  Left Ear: External ear normal.  Nose: Nose normal.  Mouth/Throat: Oropharynx is clear and moist.  Eyes: Conjunctivae and EOM are normal. Pupils are equal, round, and reactive to light. Right eye exhibits no discharge. Left eye exhibits no discharge. No scleral icterus.  Neck: Normal range of motion. Neck supple. No JVD present. No tracheal deviation present. No thyromegaly present.  Cardiovascular: Normal rate, regular rhythm, normal heart sounds and intact distal pulses.  Exam reveals no gallop and no friction rub.   No  murmur heard. Pulmonary/Chest: Breath sounds normal. No respiratory distress. He has no wheezes. He has no rales.  Abdominal: Soft. Bowel sounds are normal. He exhibits no mass. There is no hepatosplenomegaly. There is no tenderness. There is no rebound, no guarding and no CVA tenderness.  Genitourinary: Rectum normal. Rectal exam shows guaiac negative stool.  Musculoskeletal: Normal range of motion. He exhibits no edema or tenderness.  Lymphadenopathy:    He has no cervical adenopathy.  Neurological: He is alert and oriented to person, place, and time. He has normal sensation, normal strength and intact cranial nerves. No cranial nerve deficit.  Skin: Skin is warm. No rash noted.  Psychiatric: Mood and affect normal.  Nursing note and vitals reviewed.     Assessment & Plan  Problem List Items Addressed This Visit    None    Visit Diagnoses    Gastritis without bleeding, unspecified chronicity, unspecified gastritis type    -  Primary   Relevant Medications   pantoprazole (PROTONIX) 40 MG tablet   promethazine (PHENERGAN) 25 MG tablet   Other Relevant Orders   H. pylori antibody, IgG      Meds ordered this encounter  Medications  . pantoprazole (PROTONIX) 40 MG tablet    Sig: Take 1 tablet (40 mg total) by mouth daily.    Dispense:  30 tablet    Refill:  3  . promethazine (PHENERGAN) 25 MG tablet    Sig: Take 1 tablet (25 mg total) by mouth every 8 (eight) hours as needed for nausea or vomiting.    Dispense:  20 tablet    Refill:  0      Dr. Elizabeth Sauereanna Goran [facility]  08/19/16

## 2016-08-20 LAB — H. PYLORI ANTIBODY, IGG

## 2016-10-02 ENCOUNTER — Other Ambulatory Visit: Payer: Self-pay

## 2016-10-02 MED ORDER — ESCITALOPRAM OXALATE 20 MG PO TABS: 20.0000 mg | ORAL_TABLET | Freq: Every day | ORAL | 6 refills | Status: DC

## 2016-12-28 ENCOUNTER — Ambulatory Visit (INDEPENDENT_AMBULATORY_CARE_PROVIDER_SITE_OTHER): Payer: Managed Care, Other (non HMO) | Admitting: Family Medicine

## 2016-12-28 ENCOUNTER — Encounter: Payer: Self-pay | Admitting: Family Medicine

## 2016-12-28 VITALS — BP 120/80 | HR 100 | Temp 98.0°F | Ht 67.0 in | Wt 196.0 lb

## 2016-12-28 DIAGNOSIS — F3342 Major depressive disorder, recurrent, in full remission: Secondary | ICD-10-CM | POA: Diagnosis not present

## 2016-12-28 DIAGNOSIS — K2900 Acute gastritis without bleeding: Secondary | ICD-10-CM

## 2016-12-28 DIAGNOSIS — E860 Dehydration: Secondary | ICD-10-CM | POA: Diagnosis not present

## 2016-12-28 MED ORDER — PROMETHAZINE HCL 25 MG PO TABS: 25.0000 mg | ORAL_TABLET | Freq: Three times a day (TID) | ORAL | 0 refills | Status: DC | PRN

## 2016-12-28 NOTE — Patient Instructions (Signed)
Dehydration, Adult Dehydration is a condition in which there is not enough fluid or water in the body. This happens when you lose more fluids than you take in. Important organs, such as the kidneys, brain, and heart, cannot function without a proper amount of fluids. Any loss of fluids from the body can lead to dehydration. Dehydration can range from mild to severe. This condition should be treated right away to prevent it from becoming severe. What are the causes? This condition may be caused by:  Vomiting.  Diarrhea.  Excessive sweating, such as from heat exposure or exercise.  Not drinking enough fluid, especially: ? When ill. ? While doing activity that requires a lot of energy.  Excessive urination.  Fever.  Infection.  Certain medicines, such as medicines that cause the body to lose excess fluid (diuretics).  Inability to access safe drinking water.  Reduced physical ability to get adequate water and food.  What increases the risk? This condition is more likely to develop in people:  Who have a poorly controlled long-term (chronic) illness, such as diabetes, heart disease, or kidney disease.  Who are age 65 or older.  Who are disabled.  Who live in a place with high altitude.  Who play endurance sports.  What are the signs or symptoms? Symptoms of mild dehydration may include:  Thirst.  Dry lips.  Slightly dry mouth.  Dry, warm skin.  Dizziness. Symptoms of moderate dehydration may include:  Very dry mouth.  Muscle cramps.  Dark urine. Urine may be the color of tea.  Decreased urine production.  Decreased tear production.  Heartbeat that is irregular or faster than normal (palpitations).  Headache.  Light-headedness, especially when you stand up from a sitting position.  Fainting (syncope). Symptoms of severe dehydration may include:  Changes in skin, such as: ? Cold and clammy skin. ? Blotchy (mottled) or pale skin. ? Skin that does  not quickly return to normal after being lightly pinched and released (poor skin turgor).  Changes in body fluids, such as: ? Extreme thirst. ? No tear production. ? Inability to sweat when body temperature is high, such as in hot weather. ? Very little urine production.  Changes in vital signs, such as: ? Weak pulse. ? Pulse that is more than 100 beats a minute when sitting still. ? Rapid breathing. ? Low blood pressure.  Other changes, such as: ? Sunken eyes. ? Cold hands and feet. ? Confusion. ? Lack of energy (lethargy). ? Difficulty waking up from sleep. ? Short-term weight loss. ? Unconsciousness. How is this diagnosed? This condition is diagnosed based on your symptoms and a physical exam. Blood and urine tests may be done to help confirm the diagnosis. How is this treated? Treatment for this condition depends on the severity. Mild or moderate dehydration can often be treated at home. Treatment should be started right away. Do not wait until dehydration becomes severe. Severe dehydration is an emergency and it needs to be treated in a hospital. Treatment for mild dehydration may include:  Drinking more fluids.  Replacing salts and minerals in your blood (electrolytes) that you may have lost. Treatment for moderate dehydration may include:  Drinking an oral rehydration solution (ORS). This is a drink that helps you replace fluids and electrolytes (rehydrate). It can be found at pharmacies and retail stores. Treatment for severe dehydration may include:  Receiving fluids through an IV tube.  Receiving an electrolyte solution through a feeding tube that is passed through your nose   and into your stomach (nasogastric tube, or NG tube).  Correcting any abnormalities in electrolytes.  Treating the underlying cause of dehydration. Follow these instructions at home:  If directed by your health care provider, drink an ORS: ? Make an ORS by following instructions on the  package. ? Start by drinking small amounts, about  cup (120 mL) every 5-10 minutes. ? Slowly increase how much you drink until you have taken the amount recommended by your health care provider.  Drink enough clear fluid to keep your urine clear or pale yellow. If you were told to drink an ORS, finish the ORS first, then start slowly drinking other clear fluids. Drink fluids such as: ? Water. Do not drink only water. Doing that can lead to having too little salt (sodium) in the body (hyponatremia). ? Ice chips. ? Fruit juice that you have added water to (diluted fruit juice). ? Low-calorie sports drinks.  Avoid: ? Alcohol. ? Drinks that contain a lot of sugar. These include high-calorie sports drinks, fruit juice that is not diluted, and soda. ? Caffeine. ? Foods that are greasy or contain a lot of fat or sugar.  Take over-the-counter and prescription medicines only as told by your health care provider.  Do not take sodium tablets. This can lead to having too much sodium in the body (hypernatremia).  Eat foods that contain a healthy balance of electrolytes, such as bananas, oranges, potatoes, tomatoes, and spinach.  Keep all follow-up visits as told by your health care provider. This is important. Contact a health care provider if:  You have abdominal pain that: ? Gets worse. ? Stays in one area (localizes).  You have a rash.  You have a stiff neck.  You are more irritable than usual.  You are sleepier or more difficult to wake up than usual.  You feel weak or dizzy.  You feel very thirsty.  You have urinated only a small amount of very dark urine over 6-8 hours. Get help right away if:  You have symptoms of severe dehydration.  You cannot drink fluids without vomiting.  Your symptoms get worse with treatment.  You have a fever.  You have a severe headache.  You have vomiting or diarrhea that: ? Gets worse. ? Does not go away.  You have blood or green matter  (bile) in your vomit.  You have blood in your stool. This may cause stool to look black and tarry.  You have not urinated in 6-8 hours.  You faint.  Your heart rate while sitting still is over 100 beats a minute.  You have trouble breathing. This information is not intended to replace advice given to you by your health care provider. Make sure you discuss any questions you have with your health care provider. Document Released: 05/11/2005 Document Revised: 12/06/2015 Document Reviewed: 07/05/2015 Elsevier Interactive Patient Education  2018 [employer]  

## 2016-12-28 NOTE — Progress Notes (Signed)
Name: Derek Blake   MRN: [id number]    DOB: 03-06-1961   Date:12/28/2016       Progress Note  Subjective  Chief Complaint  Chief Complaint  Patient presents with  . Nausea    no vomitting or diarrhea    Emesis   This is a new problem. The current episode started in the past 7 days. Episode frequency: persistent nausis. The problem has been waxing and waning. There has been no fever. Associated symptoms include dizziness. Pertinent negatives include no abdominal pain, arthralgias, chest pain, chills, coughing, diarrhea, fever, headaches, myalgias, sweats, URI or weight loss. The treatment provided mild relief.    No problem-specific Assessment & Plan notes found for this encounter.   Past Medical History:  Diagnosis Date  . Depression     Past Surgical History:  Procedure Laterality Date  . BACK SURGERY    . COLONOSCOPY  2016   cleared for 10 yrs- Dr Bluford Kaufmannh  . NASAL SEPTUM SURGERY    . TONSILLECTOMY      Family History  Problem Relation Age of Onset  . Cancer Mother     Social History   Social History  . Marital status: Married    Spouse name: N/A  . Number of children: N/A  . Years of education: N/A   Occupational History  . Not on file.   Social History Main Topics  . Smoking status: Former Games developermoker  . Smokeless tobacco: Never Used  . Alcohol use 0.0 oz/week  . Drug use: No  . Sexual activity: Yes   Other Topics Concern  . Not on file   Social History Narrative  . No narrative on file    No Known Allergies  Outpatient Medications Prior to Visit  Medication Sig Dispense Refill  . escitalopram (LEXAPRO) 20 MG tablet Take 1 tablet (20 mg total) by mouth daily. 30 tablet 6  . pantoprazole (PROTONIX) 40 MG tablet Take 1 tablet (40 mg total) by mouth daily. 30 tablet 3  . promethazine (PHENERGAN) 25 MG tablet Take 1 tablet (25 mg total) by mouth every 8 (eight) hours as needed for nausea or vomiting. 20 tablet 0   No facility-administered medications  prior to visit.     Review of Systems  Constitutional: Negative for chills, fever, malaise/fatigue and weight loss.  HENT: Negative for ear discharge, ear pain and sore throat.   Eyes: Negative for blurred vision.  Respiratory: Negative for cough, sputum production, shortness of breath and wheezing.   Cardiovascular: Negative for chest pain, palpitations and leg swelling.  Gastrointestinal: Positive for vomiting. Negative for abdominal pain, blood in stool, constipation, diarrhea, heartburn, melena and nausea.  Genitourinary: Negative for dysuria, frequency, hematuria and urgency.  Musculoskeletal: Negative for arthralgias, back pain, joint pain, myalgias and neck pain.  Skin: Negative for rash.  Neurological: Positive for dizziness. Negative for tingling, sensory change, focal weakness and headaches.  Endo/Heme/Allergies: Negative for environmental allergies and polydipsia. Does not bruise/bleed easily.  Psychiatric/Behavioral: Negative for depression and suicidal ideas. The patient is not nervous/anxious and does not have insomnia.      Objective  Vitals:   12/28/16 1339  BP: 120/80  Pulse: 100  Temp: 98 F (36.7 C)  TempSrc: Oral  Weight: 196 lb (88.9 kg)  Height: 5\' 7"  (1.702 m)    Physical Exam  Constitutional: He is oriented to person, place, and time and well-developed, well-nourished, and in no distress.  HENT:  Head: Normocephalic.  Right Ear: External ear normal.  Left Ear: External ear normal.  Nose: Nose normal.  Mouth/Throat: Oropharynx is clear and moist.  Eyes: Pupils are equal, round, and reactive to light. Conjunctivae and EOM are normal. Right eye exhibits no discharge. Left eye exhibits no discharge. No scleral icterus.  Neck: Normal range of motion. Neck supple. No JVD present. No tracheal deviation present. No thyromegaly present.  Cardiovascular: Normal rate, regular rhythm, normal heart sounds and intact distal pulses.  Exam reveals no gallop and no  friction rub.   No murmur heard. Pulmonary/Chest: Breath sounds normal. No respiratory distress. He has no wheezes. He has no rales.  Abdominal: Soft. Bowel sounds are normal. He exhibits no mass. There is no hepatosplenomegaly. There is no tenderness. There is no rebound, no guarding and no CVA tenderness.  Musculoskeletal: Normal range of motion. He exhibits no edema or tenderness.  Lymphadenopathy:    He has no cervical adenopathy.  Neurological: He is alert and oriented to person, place, and time. He has normal sensation, normal strength, normal reflexes and intact cranial nerves. No cranial nerve deficit.  Skin: Skin is warm. No rash noted.  Psychiatric: Mood and affect normal.  Nursing note and vitals reviewed.     Assessment & Plan  Problem List Items Addressed This Visit      Other   Recurrent major depressive disorder, in full remission (HCC)    Other Visit Diagnoses    Acute gastritis without hemorrhage, unspecified gastritis type    -  Primary   resume protonix   Relevant Medications   promethazine (PHENERGAN) 25 MG tablet   Dehydration          Meds ordered this encounter  Medications  . promethazine (PHENERGAN) 25 MG tablet    Sig: Take 1 tablet (25 mg total) by mouth every 8 (eight) hours as needed for nausea or vomiting.    Dispense:  20 tablet    Refill:  0      Dr. Hayden [facility]  12/28/16

## 2017-04-05 ENCOUNTER — Other Ambulatory Visit: Payer: Self-pay

## 2017-05-07 ENCOUNTER — Other Ambulatory Visit: Payer: Self-pay | Admitting: Family Medicine

## 2017-05-28 ENCOUNTER — Encounter: Payer: Self-pay | Admitting: Family Medicine

## 2017-05-28 ENCOUNTER — Ambulatory Visit: Payer: Managed Care, Other (non HMO) | Admitting: Family Medicine

## 2017-05-28 VITALS — BP 130/80 | HR 80 | Temp 98.3°F | Ht 67.0 in | Wt 203.0 lb

## 2017-05-28 DIAGNOSIS — J40 Bronchitis, not specified as acute or chronic: Secondary | ICD-10-CM | POA: Diagnosis not present

## 2017-05-28 DIAGNOSIS — J0100 Acute maxillary sinusitis, unspecified: Secondary | ICD-10-CM | POA: Diagnosis not present

## 2017-05-28 MED ORDER — GUAIFENESIN-CODEINE 100-10 MG/5ML PO SYRP: 5.0000 mL | ORAL_SOLUTION | Freq: Three times a day (TID) | ORAL | 0 refills | Status: DC | PRN

## 2017-05-28 MED ORDER — AMOXICILLIN-POT CLAVULANATE 875-125 MG PO TABS: 1.0000 | ORAL_TABLET | Freq: Two times a day (BID) | ORAL | 0 refills | Status: DC

## 2017-05-28 NOTE — Progress Notes (Signed)
Name: Derek Blake   MRN: [id number]    DOB: 01-14-1961   Date:05/28/2017       Progress Note  Subjective  Chief Complaint  Chief Complaint  Patient presents with  . Sinusitis    cong, sore throat ( burning)- alkaseltzer plus    Sinusitis  This is a new problem. The current episode started in the past 7 days (2 days). The problem is unchanged. There has been no fever. The pain is moderate (on fire). Associated symptoms include congestion, coughing, sinus pressure, sneezing, a sore throat and swollen glands. Pertinent negatives include no chills, diaphoresis, ear pain, headaches, hoarse voice, neck pain or shortness of breath. Past treatments include oral decongestants. The treatment provided mild relief.  Cough  This is a new problem. The current episode started in the past 7 days. The problem has been gradually worsening. The cough is productive of purulent sputum (yellow). Associated symptoms include a sore throat. Pertinent negatives include no chest pain, chills, ear pain, fever, headaches, heartburn, myalgias, rash, shortness of breath, weight loss or wheezing. There is no history of environmental allergies.    No problem-specific Assessment & Plan notes found for this encounter.   Past Medical History:  Diagnosis Date  . Depression     Past Surgical History:  Procedure Laterality Date  . BACK SURGERY    . COLONOSCOPY  2016   cleared for 10 yrs- Dr Bluford Kaufmann  . NASAL SEPTUM SURGERY    . TONSILLECTOMY      Family History  Problem Relation Age of Onset  . Cancer Mother     Social History   Socioeconomic History  . Marital status: Married    Spouse name: Not on file  . Number of children: Not on file  . Years of education: Not on file  . Highest education level: Not on file  Social Needs  . Financial resource strain: Not on file  . Food insecurity - worry: Not on file  . Food insecurity - inability: Not on file  . Transportation needs - medical: Not on file  .  Transportation needs - non-medical: Not on file  Occupational History  . Not on file  Tobacco Use  . Smoking status: Former Games developer  . Smokeless tobacco: Never Used  Substance and Sexual Activity  . Alcohol use: Yes    Alcohol/week: 0.0 oz  . Drug use: No  . Sexual activity: Yes  Other Topics Concern  . Not on file  Social History Narrative  . Not on file    No Known Allergies  Outpatient Medications Prior to Visit  Medication Sig Dispense Refill  . escitalopram (LEXAPRO) 20 MG tablet TAKE 1 TABLET BY MOUTH ONCE DAILY 30 tablet 1  . pantoprazole (PROTONIX) 40 MG tablet Take 1 tablet (40 mg total) by mouth daily. 30 tablet 3  . promethazine (PHENERGAN) 25 MG tablet Take 1 tablet (25 mg total) by mouth every 8 (eight) hours as needed for nausea or vomiting. 20 tablet 0   No facility-administered medications prior to visit.     Review of Systems  Constitutional: Negative for chills, diaphoresis, fever, malaise/fatigue and weight loss.  HENT: Positive for congestion, sinus pressure, sneezing and sore throat. Negative for ear discharge, ear pain and hoarse voice.   Eyes: Negative for blurred vision.  Respiratory: Positive for cough. Negative for sputum production, shortness of breath and wheezing.   Cardiovascular: Negative for chest pain, palpitations and leg swelling.  Gastrointestinal: Negative for abdominal pain, blood  in stool, constipation, diarrhea, heartburn, melena and nausea.  Genitourinary: Negative for dysuria, frequency, hematuria and urgency.  Musculoskeletal: Negative for back pain, joint pain, myalgias and neck pain.  Skin: Negative for rash.  Neurological: Negative for dizziness, tingling, sensory change, focal weakness and headaches.  Endo/Heme/Allergies: Negative for environmental allergies and polydipsia. Does not bruise/bleed easily.  Psychiatric/Behavioral: Negative for depression and suicidal ideas. The patient is not nervous/anxious and does not have  insomnia.      Objective  Vitals:   05/28/17 0959  BP: 130/80  Pulse: 80  Temp: 98.3 F (36.8 C)  TempSrc: Oral  Weight: 203 lb (92.1 kg)  Height: 5\' 7"  (1.702 m)    Physical Exam  Constitutional: He is oriented to person, place, and time and well-developed, well-nourished, and in no distress.  HENT:  Head: Normocephalic.  Right Ear: External ear normal.  Left Ear: External ear normal.  Nose: Right sinus exhibits maxillary sinus tenderness. Left sinus exhibits maxillary sinus tenderness.  Mouth/Throat: Oropharynx is clear and moist.  Eyes: Conjunctivae and EOM are normal. Pupils are equal, round, and reactive to light. Right eye exhibits no discharge. Left eye exhibits no discharge. No scleral icterus.  Neck: Normal range of motion. Neck supple. No JVD present. No tracheal deviation present. No thyromegaly present.  Cardiovascular: Normal rate, regular rhythm, normal heart sounds and intact distal pulses. Exam reveals no gallop and no friction rub.  No murmur heard. Pulmonary/Chest: Breath sounds normal. No respiratory distress. He has no wheezes. He has no rales.  Abdominal: Soft. Bowel sounds are normal. He exhibits no mass. There is no hepatosplenomegaly. There is no tenderness. There is no rebound, no guarding and no CVA tenderness.  Musculoskeletal: Normal range of motion. He exhibits no edema or tenderness.  Lymphadenopathy:    He has no cervical adenopathy.  Neurological: He is alert and oriented to person, place, and time. He has normal sensation, normal strength, normal reflexes and intact cranial nerves. No cranial nerve deficit.  Skin: Skin is warm. No rash noted.  Psychiatric: Mood and affect normal.  Nursing note and vitals reviewed.     Assessment & Plan  Problem List Items Addressed This Visit    None    Visit Diagnoses    Acute maxillary sinusitis, recurrence not specified    -  Primary   Relevant Medications   amoxicillin-clavulanate (AUGMENTIN)  875-125 MG tablet   guaiFENesin-codeine (ROBITUSSIN AC) 100-10 MG/5ML syrup   Bronchitis       Relevant Medications   amoxicillin-clavulanate (AUGMENTIN) 875-125 MG tablet   guaiFENesin-codeine (ROBITUSSIN AC) 100-10 MG/5ML syrup      Meds ordered this encounter  Medications  . amoxicillin-clavulanate (AUGMENTIN) 875-125 MG tablet    Sig: Take 1 tablet by mouth 2 (two) times daily.    Dispense:  20 tablet    Refill:  0  . guaiFENesin-codeine (ROBITUSSIN AC) 100-10 MG/5ML syrup    Sig: Take 5 mLs by mouth 3 (three) times daily as needed for cough.    Dispense:  100 mL    Refill:  0      Dr. Elizabeth [facility]uereanna [facility]ensel [facility]  05/28/17

## 2017-07-05 ENCOUNTER — Ambulatory Visit: Payer: Managed Care, Other (non HMO) | Admitting: Family Medicine

## 2017-07-05 ENCOUNTER — Encounter: Payer: Self-pay | Admitting: Family Medicine

## 2017-07-05 VITALS — BP 130/80 | HR 80 | Ht 67.0 in | Wt 205.0 lb

## 2017-07-05 DIAGNOSIS — S91341A Puncture wound with foreign body, right foot, initial encounter: Secondary | ICD-10-CM | POA: Diagnosis not present

## 2017-07-05 DIAGNOSIS — Z23 Encounter for immunization: Secondary | ICD-10-CM

## 2017-07-05 MED ORDER — TRAMADOL HCL 50 MG PO TABS: 50.0000 mg | ORAL_TABLET | Freq: Three times a day (TID) | ORAL | 0 refills | Status: DC | PRN

## 2017-07-05 MED ORDER — AMOXICILLIN-POT CLAVULANATE 875-125 MG PO TABS: 1.0000 | ORAL_TABLET | Freq: Two times a day (BID) | ORAL | 0 refills | Status: DC

## 2017-07-05 NOTE — Progress Notes (Signed)
Name: Derek Blake   MRN: [id number]    DOB: Mar 12, 1961   Date:07/05/2017       Progress Note  Subjective  Chief Complaint  Chief Complaint  Patient presents with  . Foot Pain    R) great toe pain- stepped on rusty nail Saturday- nail was under leaves    Foot Pain  This is a new (stepped on nail) problem. The current episode started in the past 7 days. The problem occurs constantly. The problem has been gradually worsening. Associated symptoms include joint swelling. Pertinent negatives include no abdominal pain, anorexia, arthralgias, change in bowel habit, chest pain, chills, coughing, fever, headaches, myalgias, nausea, neck pain, rash, sore throat, swollen glands, urinary symptoms, vertigo, visual change or vomiting. Exacerbated by: puncture wound. Treatments tried: epsom/alcohol. The treatment provided mild relief.    No problem-specific Assessment & Plan notes found for this encounter.   Past Medical History:  Diagnosis Date  . Depression     Past Surgical History:  Procedure Laterality Date  . BACK SURGERY    . COLONOSCOPY  2016   cleared for 10 yrs- Dr Bluford Kaufmann  . NASAL SEPTUM SURGERY    . TONSILLECTOMY      Family History  Problem Relation Age of Onset  . Cancer Mother     Social History   Socioeconomic History  . Marital status: Married    Spouse name: Not on file  . Number of children: Not on file  . Years of education: Not on file  . Highest education level: Not on file  Social Needs  . Financial resource strain: Not on file  . Food insecurity - worry: Not on file  . Food insecurity - inability: Not on file  . Transportation needs - medical: Not on file  . Transportation needs - non-medical: Not on file  Occupational History  . Not on file  Tobacco Use  . Smoking status: Former Games developer  . Smokeless tobacco: Never Used  Substance and Sexual Activity  . Alcohol use: Yes    Alcohol/week: 0.0 oz  . Drug use: No  . Sexual activity: Yes  Other Topics  Concern  . Not on file  Social History Narrative  . Not on file    No Known Allergies  Outpatient Medications Prior to Visit  Medication Sig Dispense Refill  . escitalopram (LEXAPRO) 20 MG tablet TAKE 1 TABLET BY MOUTH ONCE DAILY 30 tablet 1  . pantoprazole (PROTONIX) 40 MG tablet Take 1 tablet (40 mg total) by mouth daily. 30 tablet 3  . amoxicillin-clavulanate (AUGMENTIN) 875-125 MG tablet Take 1 tablet by mouth 2 (two) times daily. 20 tablet 0  . guaiFENesin-codeine (ROBITUSSIN AC) 100-10 MG/5ML syrup Take 5 mLs by mouth 3 (three) times daily as needed for cough. 100 mL 0   No facility-administered medications prior to visit.     Review of Systems  Constitutional: Negative for chills, fever, malaise/fatigue and weight loss.  HENT: Negative for ear discharge, ear pain and sore throat.   Eyes: Negative for blurred vision and pain.  Respiratory: Negative for cough, sputum production, shortness of breath and wheezing.   Cardiovascular: Negative for chest pain, palpitations and leg swelling.  Gastrointestinal: Negative for abdominal pain, anorexia, blood in stool, change in bowel habit, constipation, diarrhea, heartburn, melena, nausea and vomiting.  Genitourinary: Negative for dysuria, frequency, hematuria and urgency.  Musculoskeletal: Positive for joint swelling. Negative for arthralgias, back pain, joint pain, myalgias and neck pain.  Skin: Negative for rash.  Neurological: Negative for dizziness, vertigo, tingling, sensory change, focal weakness and headaches.  Endo/Heme/Allergies: Negative for environmental allergies and polydipsia. Does not bruise/bleed easily.  Psychiatric/Behavioral: Negative for depression and suicidal ideas. The patient is not nervous/anxious and does not have insomnia.      Objective  Vitals:   07/05/17 0819  BP: 130/80  Pulse: 80  Weight: 205 lb (93 kg)  Height: 5\' 7"  (1.702 m)    Physical Exam  Constitutional: He is oriented to person, place,  and time and well-developed, well-nourished, and in no distress.  HENT:  Head: Normocephalic.  Right Ear: External ear normal.  Left Ear: External ear normal.  Nose: Nose normal.  Mouth/Throat: Oropharynx is clear and moist.  Eyes: Conjunctivae and EOM are normal. Pupils are equal, round, and reactive to light. Right eye exhibits no discharge. Left eye exhibits no discharge. No scleral icterus.  Neck: Normal range of motion. Neck supple. No JVD present. No tracheal deviation present. No thyromegaly present.  Cardiovascular: Normal rate, regular rhythm, normal heart sounds and intact distal pulses. Exam reveals no gallop and no friction rub.  No murmur heard. Pulmonary/Chest: Breath sounds normal. No respiratory distress. He has no wheezes. He has no rales.  Abdominal: Soft. Bowel sounds are normal. He exhibits no mass. There is no hepatosplenomegaly. There is no tenderness. There is no rebound, no guarding and no CVA tenderness.  Musculoskeletal: Normal range of motion. He exhibits no edema or tenderness.  Lymphadenopathy:    He has no cervical adenopathy.  Neurological: He is alert and oriented to person, place, and time. He has normal sensation, normal strength, normal reflexes and intact cranial nerves. No cranial nerve deficit.  Skin: Skin is warm. No rash noted.  Psychiatric: Mood and affect normal.  Nursing note and vitals reviewed.     Assessment & Plan  Problem List Items Addressed This Visit    None    Visit Diagnoses    Puncture wound of right foot with foreign body, initial encounter    -  Primary   Relevant Orders   Tdap vaccine greater than or equal to 7yo IM (Completed)      Meds ordered this encounter  Medications  . amoxicillin-clavulanate (AUGMENTIN) 875-125 MG tablet    Sig: Take 1 tablet by mouth 2 (two) times daily.    Dispense:  20 tablet    Refill:  0  . traMADol (ULTRAM) 50 MG tablet    Sig: Take 1 tablet (50 mg total) by mouth every 8 (eight) hours  as needed.    Dispense:  15 tablet    Refill:  0      Dr. Hayden Rasmusseneanna Dawnell [facility]  07/05/17

## 2017-07-09 ENCOUNTER — Other Ambulatory Visit: Payer: Self-pay

## 2017-07-09 MED ORDER — ESCITALOPRAM OXALATE 20 MG PO TABS: 20.0000 mg | ORAL_TABLET | Freq: Every day | ORAL | 0 refills | Status: DC

## 2017-07-20 ENCOUNTER — Ambulatory Visit: Payer: Managed Care, Other (non HMO) | Admitting: Family Medicine

## 2017-07-20 ENCOUNTER — Encounter: Payer: Self-pay | Admitting: Family Medicine

## 2017-07-20 VITALS — BP 130/98 | HR 76 | Ht 70.0 in | Wt 203.0 lb

## 2017-07-20 DIAGNOSIS — K602 Anal fissure, unspecified: Secondary | ICD-10-CM

## 2017-07-20 DIAGNOSIS — K6289 Other specified diseases of anus and rectum: Secondary | ICD-10-CM

## 2017-07-20 DIAGNOSIS — N411 Chronic prostatitis: Secondary | ICD-10-CM

## 2017-07-20 LAB — HEMOCCULT GUIAC POC 1CARD (OFFICE): Fecal Occult Blood, POC: NEGATIVE

## 2017-07-20 MED ORDER — HYDROCORTISONE 2.5 % RE CREA: 1.0000 "application " | TOPICAL_CREAM | Freq: Two times a day (BID) | RECTAL | 0 refills | Status: DC

## 2017-07-20 MED ORDER — TRAMADOL HCL 50 MG PO TABS: 50.0000 mg | ORAL_TABLET | Freq: Three times a day (TID) | ORAL | 0 refills | Status: DC | PRN

## 2017-07-20 MED ORDER — HYOSCYAMINE SULFATE 0.125 MG PO TABS: 0.1250 mg | ORAL_TABLET | ORAL | 0 refills | Status: DC | PRN

## 2017-07-20 MED ORDER — HYDROCORTISONE ACE-PRAMOXINE 1-1 % RE FOAM: 1.0000 | Freq: Two times a day (BID) | RECTAL | 0 refills | Status: DC

## 2017-07-20 MED ORDER — DOXYCYCLINE HYCLATE 100 MG PO TABS: 100.0000 mg | ORAL_TABLET | Freq: Two times a day (BID) | ORAL | 0 refills | Status: DC

## 2017-07-20 NOTE — Addendum Note (Signed)
Addended by: Arthur HolmsLYNCH, Mayanna Garlitz L on: 07/20/2017 02:00 PM   Modules accepted: Orders

## 2017-07-20 NOTE — Progress Notes (Signed)
Name: Derek Blake   MRN: [id number]    DOB: [birth year]   Date:07/20/2017       Progress Note  Subjective  Chief Complaint  No chief complaint on file.   Rectal Bleeding   The current episode started 5 to 7 days ago (rectal pain). The onset was sudden. The problem occurs continuously. The problem has been gradually worsening. The pain is severe. The stool is described as soft. Prior successful therapies include stool softeners. Ineffective treatments: sitz baths. Associated symptoms include rectal pain. Pertinent negatives include no fever, no abdominal pain, no diarrhea, no nausea, no hematuria, no chest pain, no headaches, no coughing and no rash.    No problem-specific Assessment & Plan notes found for this encounter.   Past Medical History:  Diagnosis Date  . Depression     Past Surgical History:  Procedure Laterality Date  . BACK SURGERY    . COLONOSCOPY  2016   cleared for 10 yrs- Dr Bluford Kaufmannh  . NASAL SEPTUM SURGERY    . TONSILLECTOMY      Family History  Problem Relation Age of Onset  . Cancer Mother     Social History   Socioeconomic History  . Marital status: Married    Spouse name: Not on file  . Number of children: Not on file  . Years of education: Not on file  . Highest education level: Not on file  Social Needs  . Financial resource strain: Not on file  . Food insecurity - worry: Not on file  . Food insecurity - inability: Not on file  . Transportation needs - medical: Not on file  . Transportation needs - non-medical: Not on file  Occupational History  . Not on file  Tobacco Use  . Smoking status: Former Games developermoker  . Smokeless tobacco: Never Used  Substance and Sexual Activity  . Alcohol use: Yes    Alcohol/week: 0.0 oz  . Drug use: No  . Sexual activity: Yes  Other Topics Concern  . Not on file  Social History Narrative  . Not on file    No Known Allergies  Outpatient Medications Prior to Visit  Medication Sig Dispense Refill  .  amoxicillin-clavulanate (AUGMENTIN) 875-125 MG tablet Take 1 tablet by mouth 2 (two) times daily. 20 tablet 0  . escitalopram (LEXAPRO) 20 MG tablet Take 1 tablet (20 mg total) by mouth daily. 90 tablet 0  . pantoprazole (PROTONIX) 40 MG tablet Take 1 tablet (40 mg total) by mouth daily. 30 tablet 3  . traMADol (ULTRAM) 50 MG tablet Take 1 tablet (50 mg total) by mouth every 8 (eight) hours as needed. 15 tablet 0   No facility-administered medications prior to visit.     Review of Systems  Constitutional: Negative for chills, fever, malaise/fatigue and weight loss.  HENT: Negative for ear discharge, ear pain and sore throat.   Eyes: Negative for blurred vision.  Respiratory: Negative for cough, sputum production, shortness of breath and wheezing.   Cardiovascular: Negative for chest pain, palpitations and leg swelling.  Gastrointestinal: Positive for hematochezia and rectal pain. Negative for abdominal pain, blood in stool, constipation, diarrhea, heartburn, melena and nausea.  Genitourinary: Negative for dysuria, frequency, hematuria and urgency.  Musculoskeletal: Negative for back pain, joint pain, myalgias and neck pain.  Skin: Negative for rash.  Neurological: Negative for dizziness, tingling, sensory change, focal weakness and headaches.  Endo/Heme/Allergies: Negative for environmental allergies and polydipsia. Does not bruise/bleed easily.  Psychiatric/Behavioral: Negative for depression and suicidal  ideas. The patient is not nervous/anxious and does not have insomnia.      Objective  Vitals:   07/20/17 1126  BP: (!) 130/98  Pulse: 76  Weight: 203 lb (92.1 kg)  Height: 5\' 10"  (1.778 m)    Physical Exam  Constitutional: He is oriented to person, place, and time and well-developed, well-nourished, and in no distress.  HENT:  Head: Normocephalic.  Right Ear: External ear normal.  Left Ear: External ear normal.  Nose: Nose normal.  Mouth/Throat: Oropharynx is clear and  moist.  Eyes: Conjunctivae and EOM are normal. Pupils are equal, round, and reactive to light. Right eye exhibits no discharge. Left eye exhibits no discharge. No scleral icterus.  Neck: Normal range of motion. Neck supple. No JVD present. No tracheal deviation present. No thyromegaly present.  Cardiovascular: Normal rate, regular rhythm, normal heart sounds and intact distal pulses. Exam reveals no gallop and no friction rub.  No murmur heard. Pulmonary/Chest: Breath sounds normal. No respiratory distress. He has no wheezes. He has no rales.  Abdominal: Soft. Bowel sounds are normal. He exhibits no mass. There is no hepatosplenomegaly. There is no tenderness. There is no rebound, no guarding and no CVA tenderness.  Genitourinary: Rectal exam shows internal hemorrhoid and tenderness. Rectal exam shows guaiac negative stool. Prostate is tender. Prostate is not enlarged.  Musculoskeletal: Normal range of motion. He exhibits no edema or tenderness.  Lymphadenopathy:    He has no cervical adenopathy.  Neurological: He is alert and oriented to person, place, and time. He has normal sensation, normal strength, normal reflexes and intact cranial nerves. No cranial nerve deficit.  Skin: Skin is warm. No rash noted.  Psychiatric: Mood and affect normal.  Nursing note and vitals reviewed.     Assessment & Plan  Problem List Items Addressed This Visit    None    Visit Diagnoses    Anal fissure    -  Primary   Relevant Medications   hydrocortisone-pramoxine (PROCTOFOAM-HC) rectal foam   Proctalgia       Relevant Medications   hyoscyamine (LEVSIN, ANASPAZ) 0.125 MG tablet   traMADol (ULTRAM) 50 MG tablet   Other Relevant Orders   POCT occult blood stool (Completed)   Subacute prostatitis       Relevant Medications   doxycycline (VIBRA-TABS) 100 MG tablet      Meds ordered this encounter  Medications  . hyoscyamine (LEVSIN, ANASPAZ) 0.125 MG tablet    Sig: Take 1 tablet (0.125 mg total)  by mouth every 4 (four) hours as needed.    Dispense:  30 tablet    Refill:  0  . doxycycline (VIBRA-TABS) 100 MG tablet    Sig: Take 1 tablet (100 mg total) by mouth 2 (two) times daily.    Dispense:  20 tablet    Refill:  0  . traMADol (ULTRAM) 50 MG tablet    Sig: Take 1 tablet (50 mg total) by mouth every 8 (eight) hours as needed.    Dispense:  15 tablet    Refill:  0  . hydrocortisone-pramoxine (PROCTOFOAM-HC) rectal foam    Sig: Place 1 applicator rectally 2 (two) times daily.    Dispense:  10 g    Refill:  0      Dr. Hayden [facility]  07/20/17

## 2017-07-20 NOTE — Patient Instructions (Signed)
How to Take a Sitz Bath A sitz bath is a warm water bath that is taken while you are sitting down. The water should only come up to your hips and should cover your buttocks. Your health care provider may recommend a sitz bath to help you:  Clean the lower part of your body, including your genital area.  With itching.  With pain.  With sore muscles or muscles that tighten or spasm.  How to take a sitz bath Take 3-4 sitz baths per day or as told by your health care provider. 1. Partially fill a bathtub with warm water. You will only need the water to be deep enough to cover your hips and buttocks when you are sitting in it. 2. If your health care provider told you to put medicine in the water, follow the directions exactly. 3. Sit in the water and open the tub drain a little. 4. Turn on the warm water again to keep the tub at the correct level. Keep the water running constantly. 5. Soak in the water for 15-20 minutes or as told by your health care provider. 6. After the sitz bath, pat the affected area dry first. Do not rub it. 7. Be careful when you stand up after the sitz bath because you may feel dizzy.  Contact a health care provider if:  Your symptoms get worse. Do not continue with sitz baths if your symptoms get worse.  You have new symptoms. Do not continue with sitz baths until you talk with your health care provider. This information is not intended to replace advice given to you by your health care provider. Make sure you discuss any questions you have with your health care provider. Document Released: 02/01/2004 Document Revised: 10/09/2015 Document Reviewed: 05/09/2014 Elsevier Interactive Patient Education  2018 [employer] Anal Fissure, Adult An anal fissure is a small tear or crack in the skin around the anus. Bleeding from a fissure usually stops on its own within a few minutes. However, bleeding will often occur again with each bowel movement until the crack  heals. What are the causes? This condition may be caused by:  Passing large, hard stool (feces).  Frequent diarrhea.  Constipation.  Inflammatory bowel disease (Crohn disease or ulcerative colitis).  Infections.  Anal sex.  What are the signs or symptoms? Symptoms of this condition include:  Bleeding from the rectum.  Small amounts of blood seen on your stool, on toilet paper, or in the toilet after a bowel movement.  Painful bowel movements.  Itching or irritation around the anus.  How is this diagnosed? A health care provider may diagnose this condition by closely examining the anal area. An anal fissure can usually be seen with careful inspection. In some cases, a rectal exam may be performed, or a short tube (anoscope) may be used to examine the anal canal. How is this treated? Treatment for this condition may include:  Taking steps to avoid constipation. This may include making changes to your diet, such as increasing your intake of fiber or fluid.  Taking fiber supplements. These supplements can soften your stool to help make bowel movements easier. Your health care provider may also prescribe a stool softener if your stool is often hard.  Taking sitz baths. This may help to heal the tear.  Using medicated creams or ointments. These may be prescribed to lessen discomfort.  Follow these instructions at home: Eating and drinking  Avoid foods that may be constipating, such as bananas  and dairy products.  Drink enough fluid to keep your urine clear or pale yellow.  Maintain a diet that is high in fruits, whole grains, and vegetables. General instructions  Keep the anal area as clean and dry as possible.  Take sitz baths as told by your health care provider. Do not use soap in the sitz baths.  Take over-the-counter and prescription medicines only as told by your health care provider.  Use creams or ointments only as told by your health care provider.  Keep  all follow-up visits as told by your health care provider. This is important. Contact a health care provider if:  You have more bleeding.  You have a fever.  You have diarrhea that is mixed with blood.  You continue to have pain.  Your problem is getting worse rather than better. This information is not intended to replace advice given to you by your health care provider. Make sure you discuss any questions you have with your health care provider. Document Released: 05/11/2005 Document Revised: 09/18/2015 Document Reviewed: 08/06/2014 Elsevier Interactive Patient Education  [employer].

## 2017-10-12 ENCOUNTER — Other Ambulatory Visit: Payer: Self-pay

## 2017-10-12 MED ORDER — ESCITALOPRAM OXALATE 20 MG PO TABS: 20.0000 mg | ORAL_TABLET | Freq: Every day | ORAL | 0 refills | Status: DC

## 2017-10-23 ENCOUNTER — Ambulatory Visit
Admission: EM | Admit: 2017-10-23 | Discharge: 2017-10-23 | Disposition: A | Discharge status: Home / Self Care | Payer: Managed Care, Other (non HMO)

## 2017-10-23 ENCOUNTER — Encounter: Payer: Self-pay | Admitting: Gynecology

## 2017-10-23 ENCOUNTER — Other Ambulatory Visit: Payer: Self-pay

## 2017-10-23 DIAGNOSIS — M5010 Cervical disc disorder with radiculopathy, unspecified cervical region: Secondary | ICD-10-CM

## 2017-10-23 DIAGNOSIS — M542 Cervicalgia: Secondary | ICD-10-CM

## 2017-10-23 DIAGNOSIS — M5382 Other specified dorsopathies, cervical region: Secondary | ICD-10-CM | POA: Diagnosis not present

## 2017-10-23 MED ORDER — NAPROXEN 500 MG PO TABS: 500.0000 mg | ORAL_TABLET | Freq: Two times a day (BID) | ORAL | 0 refills | Status: DC

## 2017-10-23 MED ORDER — KETOROLAC TROMETHAMINE 60 MG/2ML IM SOLN
60.0000 mg | Freq: Once | INTRAMUSCULAR | Status: AC
  Administered 2017-10-23: 60 mg via INTRAMUSCULAR

## 2017-10-23 MED ORDER — LIDOCAINE HCL (PF) 1 % IJ SOLN
1.0000 mL | Freq: Once | INTRAMUSCULAR | Status: AC
  Administered 2017-10-23: 1 mL via SUBCUTANEOUS

## 2017-10-23 MED ORDER — METAXALONE 800 MG PO TABS: 800.0000 mg | ORAL_TABLET | Freq: Three times a day (TID) | ORAL | 0 refills | Status: DC

## 2017-10-23 MED ORDER — TRIAMCINOLONE ACETONIDE 40 MG/ML IJ SUSP
40.0000 mg | Freq: Once | INTRAMUSCULAR | Status: AC
  Administered 2017-10-23: 40 mg

## 2017-10-23 NOTE — ED Triage Notes (Signed)
Patient c/o left arm pain. Per patient pain started in neck and rotate to his left arm.

## 2017-10-23 NOTE — ED Provider Notes (Signed)
[facility]    CSN: [id number] Arrival date & time: 10/23/17  1358     History   Chief Complaint Chief Complaint  Patient presents with  . Neck Pain  . Shoulder Pain    HPI Derek Blake is a 57 y.o. male.   HPI  57 year old male presents with neck pain radiating into his upper left back along the medial scapular border into his left arm.  Deviates into his posterior left arm to his flexor surface of his forearm.  Does not involve his hand or fingers.  Feels a "catch" with forward flexion or extension of his neck.  Extensive history of lumbar spinal stenosis having had 3 surgeries so far.  Fortunately has to take care of his wife who has cancer and is very upset that he is having the pain that he is having at the present time.  Logical symptoms has not had any difficulty walking.  No incontinence.        Past Medical History:  Diagnosis Date  . Depression     There are no active problems to display for this patient.   Past Surgical History:  Procedure Laterality Date  . BACK SURGERY    . COLONOSCOPY  2016   cleared for 10 yrs- Dr Bluford Kaufmann  . NASAL SEPTUM SURGERY    . TONSILLECTOMY         Home Medications    Prior to Admission medications   Medication Sig Start Date End Date Taking? Authorizing Provider  escitalopram (LEXAPRO) 20 MG tablet Take 1 tablet (20 mg total) by mouth daily. 10/12/17  Yes Duanne Limerick, MD  diazepam (VALIUM) 5 MG tablet  09/14/17   [provider]  metaxalone (SKELAXIN) 800 MG tablet Take 1 tablet (800 mg total) by mouth 3 (three) times daily. 10/23/17   Lutricia Feil, PA-C  naproxen (NAPROSYN) 500 MG tablet Take 1 tablet (500 mg total) by mouth 2 (two) times daily with a meal. 10/23/17   Lutricia Feil, PA-C    Family History Family History  Problem Relation Age of Onset  . Cancer Mother     Social History Social History   Tobacco Use  . Smoking status: Former Games developer  . Smokeless tobacco: Never Used    Substance Use Topics  . Alcohol use: Yes    Alcohol/week: 0.0 oz  . Drug use: No     Allergies   Patient has no known allergies.   Review of Systems Review of Systems  Constitutional: Positive for activity change. Negative for chills, fatigue and fever.  Musculoskeletal: Positive for myalgias, neck pain and neck stiffness.  All other systems reviewed and are negative.    Physical Exam Triage Vital Signs ED Triage Vitals  Enc Vitals Group     BP 10/23/17 1424 (!) 154/100     Pulse Rate 10/23/17 1424 85     Resp 10/23/17 1424 18     Temp 10/23/17 1424 97.6 F (36.4 C)     Temp Source 10/23/17 1424 Oral     SpO2 10/23/17 1424 100 %     Weight 10/23/17 1424 210 lb (95.3 kg)     Height 10/23/17 1424 5\' 10"  (1.778 m)     Head Circumference --      Peak Flow --      Pain Score 10/23/17 1423 9     Pain Loc --      Pain Edu? --      Excl. in  GC? --    No data found.  Updated Vital Signs BP (!) 154/100 (BP Location: Right Arm)   Pulse 85   Temp 97.6 F (36.4 C) (Oral)   Resp 18   Ht 5\' 10"  (1.778 m)   Wt 210 lb (95.3 kg)   SpO2 100%   BMI 30.13 kg/m   Visual Acuity Right Eye Distance:   Left Eye Distance:   Bilateral Distance:    Right Eye Near:   Left Eye Near:    Bilateral Near:     Physical Exam  Constitutional: He is oriented to person, place, and time. He appears well-developed and well-nourished. No distress.  HENT:  Head: Normocephalic.  Eyes: Pupils are equal, round, and reactive to light. Right eye exhibits no discharge. Left eye exhibits no discharge.  Neck:  Examination of the cervical spine shows fairly full range of motion with the discomfort at the extremes particularly flexion extension rotation and lateral flexion with extension.  Upper extremity strength is intact.  DTRs are 1+/4 but symmetrical.  Sensation in the upper extremities are intact to light touch.  Along the medial scapular border is a trigger point is very well delineated.   Reproduces pain.  Pulmonary/Chest: Effort normal and breath sounds normal.  Musculoskeletal: Normal range of motion. He exhibits tenderness.  Refer to the neck examination  Neurological: He is alert and oriented to person, place, and time. He displays normal reflexes. No cranial nerve deficit or sensory deficit. He exhibits normal muscle tone. Coordination normal.  Skin: Skin is warm and dry. He is not diaphoretic.  Psychiatric: He has a normal mood and affect. His behavior is normal. Judgment and thought content normal.  Nursing note and vitals reviewed.    UC Treatments / Results  Labs (all labs ordered are listed, but only abnormal results are displayed) Labs Reviewed - No data to display  EKG None  Radiology No results found.  Procedures  After Informed consent, the area of the trigger point was delineated and marked.  This was prepped widely with Hibiclens cleanser.  1 cc of Kenalog with 1 cc of lidocaine plain was then injected utilizing 25-gauge 1inch needle.  Infiltrated into the area of the trigger point.  Patient noticed at least 50% improvement after the injection.  Patient tolerated the procedure well without any complications.  Medications Ordered in UC Medications  ketorolac (TORADOL) injection 60 mg (60 mg Intramuscular Given by Other 10/23/17 1455)  lidocaine (PF) (XYLOCAINE) 1 % injection 1 mL (1 mL Subcutaneous Given 10/23/17 1455)  triamcinolone acetonide (KENALOG-40) injection 40 mg (40 mg Other Given by Other 10/23/17 1455)    Initial Impression / Assessment and Plan / UC Course  I have reviewed the triage vital signs and the nursing notes.  Pertinent labs & imaging results that were available during my care of the patient were reviewed by me and considered in my medical decision making (see chart for details).     Plan: 1. Test/x-ray results and diagnosis reviewed with patient 2. rx as per orders; risks, benefits, potential side effects reviewed with  patient 3. Recommend supportive treatment with ice 20 minutes out of every 2 hours 4-5 times daily.  Sized him that once the lidocaine effect wears off that he may have increased pain and should use ice in this area.  Explaned the repository steroid  injection to the patient.  We will also prescribe nonsteroidal anti-inflammatory drugs and muscle relaxers.  He should follow-up with his primary care  physician in 1 to 2 weeks. 4. F/u prn if symptoms worsen or don't improve  Final Clinical Impressions(s) / UC Diagnoses   Final diagnoses:  Cervical disc disorder with radiculopathy of cervical region  Trigger point with neck pain     Discharge Instructions     Apply ice 20 minutes out of every 2 hours 4-5 times daily for comfort. Use  Caution while taking muscle relaxers.  Do not perform activities requiring concentration or judgment and do not drive.    ED Prescriptions    Medication Sig Dispense Auth. Provider   metaxalone (SKELAXIN) 800 MG tablet Take 1 tablet (800 mg total) by mouth 3 (three) times daily. 21 tablet Ovid Curdoemer, Ferrel Simington P, PA-C   naproxen (NAPROSYN) 500 MG tablet Take 1 tablet (500 mg total) by mouth 2 (two) times daily with a meal. 60 tablet Lutricia Feiloemer, Doy Taaffe P, PA-C     Controlled Substance Prescriptions [place] Controlled Substance Registry consulted? Not Applicable   Lutricia FeilRoemer, Lalita Ebel P, PA-C 10/23/17 [id number]

## 2017-10-23 NOTE — Discharge Instructions (Signed)
Apply ice 20 minutes out of every 2 hours 4-5 times daily for comfort. Use  Caution while taking muscle relaxers.  Do not perform activities requiring concentration or judgment and do not drive.  °

## 2017-10-25 ENCOUNTER — Encounter: Payer: Self-pay | Admitting: Family Medicine

## 2017-10-25 ENCOUNTER — Ambulatory Visit: Payer: Managed Care, Other (non HMO) | Admitting: Family Medicine

## 2017-10-25 VITALS — BP 132/80 | HR 72 | Ht 70.0 in | Wt 209.0 lb

## 2017-10-25 DIAGNOSIS — M5412 Radiculopathy, cervical region: Secondary | ICD-10-CM | POA: Diagnosis not present

## 2017-10-25 DIAGNOSIS — M5090 Cervical disc disorder, unspecified, unspecified cervical region: Secondary | ICD-10-CM | POA: Diagnosis not present

## 2017-10-25 MED ORDER — TRAMADOL HCL 50 MG PO TABS: 50.0000 mg | ORAL_TABLET | Freq: Four times a day (QID) | ORAL | 0 refills | Status: DC | PRN

## 2017-10-25 NOTE — Progress Notes (Signed)
Name: Derek Blake   MRN: [id number]    DOB: Oct 17, 1960   Date:10/25/2017       Progress Note  Subjective  Chief Complaint  Chief Complaint  Patient presents with  . Arm Pain    L) arm pain coming from neck. Was seen at urgent care and given 2 shots, muscle relaxer and naproxen    Arm Pain   The incident occurred 5 to 7 days ago (started 1 week ago). There was no injury mechanism (hx for lumbar stenosis). The pain is present in the left forearm, left elbow, left shoulder and upper left arm. The quality of the pain is described as aching. The pain radiates to the left arm. The pain is at a severity of 9/10. The pain is moderate. The pain has been intermittent since the incident. Pertinent negatives include no chest pain, muscle weakness, numbness or tingling. He has tried NSAIDs (trigger point injected) for the symptoms. The treatment provided no relief.    No problem-specific Assessment & Plan notes found for this encounter.   Past Medical History:  Diagnosis Date  . Depression     Past Surgical History:  Procedure Laterality Date  . BACK SURGERY    . COLONOSCOPY  2016   cleared for 10 yrs- Dr Bluford Kaufmann  . NASAL SEPTUM SURGERY    . TONSILLECTOMY      Family History  Problem Relation Age of Onset  . Cancer Mother     Social History   Socioeconomic History  . Marital status: Married    Spouse name: Not on file  . Number of children: Not on file  . Years of education: Not on file  . Highest education level: Not on file  Occupational History  . Not on file  Social Needs  . Financial resource strain: Not on file  . Food insecurity:    Worry: Not on file    Inability: Not on file  . Transportation needs:    Medical: Not on file    Non-medical: Not on file  Tobacco Use  . Smoking status: Former Games developer  . Smokeless tobacco: Never Used  Substance and Sexual Activity  . Alcohol use: Yes    Alcohol/week: 0.0 oz  . Drug use: No  . Sexual activity: Yes  Lifestyle  .  Physical activity:    Days per week: Not on file    Minutes per session: Not on file  . Stress: Not on file  Relationships  . Social connections:    Talks on phone: Not on file    Gets together: Not on file    Attends religious service: Not on file    Active member of club or organization: Not on file    Attends meetings of clubs or organizations: Not on file    Relationship status: Not on file  . Intimate partner violence:    Fear of current or ex partner: Not on file    Emotionally abused: Not on file    Physically abused: Not on file    Forced sexual activity: Not on file  Other Topics Concern  . Not on file  Social History Narrative  . Not on file    No Known Allergies  Outpatient Medications Prior to Visit  Medication Sig Dispense Refill  . escitalopram (LEXAPRO) 20 MG tablet Take 1 tablet (20 mg total) by mouth daily. 90 tablet 0  . metaxalone (SKELAXIN) 800 MG tablet Take 1 tablet (800 mg total) by mouth 3 (three)  times daily. 21 tablet 0  . naproxen (NAPROSYN) 500 MG tablet Take 1 tablet (500 mg total) by mouth 2 (two) times daily with a meal. 60 tablet 0  . diazepam (VALIUM) 5 MG tablet   1   No facility-administered medications prior to visit.     Review of Systems  Constitutional: Negative for chills, fever, malaise/fatigue and weight loss.  HENT: Negative for ear discharge, ear pain and sore throat.   Eyes: Negative for blurred vision.  Respiratory: Negative for cough, sputum production, shortness of breath and wheezing.   Cardiovascular: Negative for chest pain, palpitations and leg swelling.  Gastrointestinal: Negative for abdominal pain, blood in stool, constipation, diarrhea, heartburn, melena and nausea.  Genitourinary: Negative for dysuria, frequency, hematuria and urgency.  Musculoskeletal: Negative for back pain, joint pain, myalgias and neck pain.  Skin: Negative for rash.  Neurological: Negative for dizziness, tingling, sensory change, focal  weakness, numbness and headaches.  Endo/Heme/Allergies: Negative for environmental allergies and polydipsia. Does not bruise/bleed easily.  Psychiatric/Behavioral: Negative for depression and suicidal ideas. The patient is not nervous/anxious and does not have insomnia.      Objective  Vitals:   10/25/17 0839  BP: 132/80  Pulse: 72  Weight: 209 lb (94.8 kg)  Height: 5\' 10"  (1.778 m)    Physical Exam  Constitutional: He is oriented to person, place, and time.  HENT:  Head: Normocephalic.  Right Ear: External ear normal.  Left Ear: External ear normal.  Nose: Nose normal.  Mouth/Throat: Oropharynx is clear and moist.  Eyes: Pupils are equal, round, and reactive to light. Conjunctivae and EOM are normal. Right eye exhibits no discharge. Left eye exhibits no discharge. No scleral icterus.  Neck: Normal range of motion. Neck supple. No JVD present. No tracheal deviation present. No thyromegaly present.  Cardiovascular: Normal rate, regular rhythm, normal heart sounds and intact distal pulses. Exam reveals no gallop and no friction rub.  No murmur heard. Pulmonary/Chest: Breath sounds normal. No respiratory distress. He has no wheezes. He has no rales.  Abdominal: Soft. Bowel sounds are normal. He exhibits no mass. There is no hepatosplenomegaly. There is no tenderness. There is no rebound, no guarding and no CVA tenderness.  Musculoskeletal: Normal range of motion. He exhibits no edema.       Left shoulder: He exhibits tenderness, pain and spasm. He exhibits normal range of motion, no bony tenderness, no swelling, no effusion, no deformity, normal pulse and normal strength.       Cervical back: He exhibits spasm.  Tender rhomboid/teres and suprascapular  Lymphadenopathy:    He has no cervical adenopathy.  Neurological: He is alert and oriented to person, place, and time. He has normal strength. No cranial nerve deficit or sensory deficit.  Reflex Scores:      Tricep reflexes are 1+  on the right side and 1+ on the left side.      Bicep reflexes are 1+ on the right side and 1+ on the left side.      Brachioradialis reflexes are 1+ on the right side and 1+ on the left side. Skin: Skin is warm. No rash noted.  Nursing note and vitals reviewed.     Assessment & Plan  Problem List Items Addressed This Visit    None    Visit Diagnoses    Cervical disc disease    -  Primary   New onset without relief injection, skelaxin, and naproxen. will add tramadol, cervical xray, and referral to ortho  for evaluation and treatment.    Relevant Medications   traMADol (ULTRAM) 50 MG tablet   Other Relevant Orders   DG Cervical Spine Complete   Ambulatory referral to Orthopedic Surgery   Cervical radicular pain       Patient history of lumbar stenosis which feels simular   Relevant Medications   traMADol (ULTRAM) 50 MG tablet   Other Relevant Orders   DG Cervical Spine Complete   Ambulatory referral to Orthopedic Surgery      Meds ordered this encounter  Medications  . traMADol (ULTRAM) 50 MG tablet    Sig: Take 1 tablet (50 mg total) by mouth every 6 (six) hours as needed.    Dispense:  20 tablet    Refill:  0      Dr. Hayden Rasmusseneanna Oona Trammel [facility]  10/25/17

## 2017-10-25 NOTE — Patient Instructions (Signed)
Cervical Radiculopathy Cervical radiculopathy happens when a nerve in the neck (cervical nerve) is pinched or bruised. This condition can develop because of an injury or as part of the normal aging process. Pressure on the cervical nerves can cause pain or numbness that runs from the neck all the way down into the arm and fingers. Usually, this condition gets better with rest. Treatment may be needed if the condition does not improve. What are the causes? This condition may be caused by:  Injury.  Slipped (herniated) disk.  Muscle tightness in the neck because of overuse.  Arthritis.  Breakdown or degeneration in the bones and joints of the spine (spondylosis) due to aging.  Bone spurs that may develop near the cervical nerves.  What are the signs or symptoms? Symptoms of this condition include:  Pain that runs from the neck to the arm and hand. The pain can be severe or irritating. It may be worse when the neck is moved.  Numbness or weakness in the affected arm and hand.  How is this diagnosed? This condition may be diagnosed based on symptoms, medical history, and a physical exam. You may also have tests, including:  X-rays.  CT scan.  MRI.  Electromyogram (EMG).  Nerve conduction tests.  How is this treated? In many cases, treatment is not needed for this condition. With rest, the condition usually gets better over time. If treatment is needed, options may include:  Wearing a soft neck collar for short periods of time.  Physical therapy to strengthen your neck muscles.  Medicines, such as NSAIDs, oral corticosteroids, or spinal injections.  Surgery. This may be needed if other treatments do not help. Various types of surgery may be done depending on the cause of your problems.  Follow these instructions at home: Managing pain  Take over-the-counter and prescription medicines only as told by your health care provider.  If directed, apply ice to the affected  area. ? Put ice in a plastic bag. ? Place a towel between your skin and the bag. ? Leave the ice on for 20 minutes, 2-3 times per day.  If ice does not help, you can try using heat. Take a warm shower or warm bath, or use a heat pack as told by your health care provider.  Try a gentle neck and shoulder massage to help relieve symptoms. Activity  Rest as needed. Follow instructions from your health care provider about any restrictions on activities.  Do stretching and strengthening exercises as told by your health care provider or physical therapist. General instructions  If you were given a soft collar, wear it as told by your health care provider.  Use a flat pillow when you sleep.  Keep all follow-up visits as told by your health care provider. This is important. Contact a health care provider if:  Your condition does not improve with treatment. Get help right away if:  Your pain gets much worse and cannot be controlled with medicines.  You have weakness or numbness in your hand, arm, face, or leg.  You have a high fever.  You have a stiff, rigid neck.  You lose control of your bowels or your bladder (have incontinence).  You have trouble with walking, balance, or speaking. This information is not intended to replace advice given to you by your health care provider. Make sure you discuss any questions you have with your health care provider. Document Released: 02/03/2001 Document Revised: 10/17/2015 Document Reviewed: 07/05/2014 Elsevier Interactive Patient Education    2018 [employer]  

## 2017-10-28 DIAGNOSIS — M5412 Radiculopathy, cervical region: Secondary | ICD-10-CM | POA: Insufficient documentation

## 2018-01-14 ENCOUNTER — Other Ambulatory Visit: Payer: Self-pay

## 2018-01-14 MED ORDER — ESCITALOPRAM OXALATE 20 MG PO TABS: 20.0000 mg | ORAL_TABLET | Freq: Every day | ORAL | 0 refills | Status: DC

## 2018-02-24 ENCOUNTER — Ambulatory Visit (INDEPENDENT_AMBULATORY_CARE_PROVIDER_SITE_OTHER): Payer: Managed Care, Other (non HMO) | Admitting: Family Medicine

## 2018-02-24 ENCOUNTER — Encounter: Payer: Self-pay | Admitting: Family Medicine

## 2018-02-24 VITALS — BP 120/78 | HR 60 | Ht 70.0 in | Wt 202.8 lb

## 2018-02-24 DIAGNOSIS — R110 Nausea: Secondary | ICD-10-CM

## 2018-02-24 MED ORDER — ONDANSETRON HCL 4 MG PO TABS: 4.0000 mg | ORAL_TABLET | Freq: Three times a day (TID) | ORAL | 0 refills | Status: DC | PRN

## 2018-02-24 MED ORDER — GABAPENTIN 100 MG PO CAPS: 100.0000 mg | ORAL_CAPSULE | Freq: Three times a day (TID) | ORAL | 0 refills | Status: DC

## 2018-02-24 NOTE — Progress Notes (Signed)
Date:  02/24/2018   Name:  Derek Blake   DOB:  06-22-1960   MRN:  [id number]   Chief Complaint: Nausea (no diarrhea or vomitting) Emesis   This is a new problem. The current episode started in the past 7 days. The problem occurs intermittently. The problem has been waxing and waning. There has been no fever. Pertinent negatives include no abdominal pain, arthralgias, chest pain, chills, coughing, diarrhea, dizziness, fever, headaches, myalgias, sweats, URI or weight loss. He has tried acetaminophen for the symptoms. The treatment provided mild relief.     Review of Systems  Constitutional: Negative for chills, fever and weight loss.  HENT: Negative for drooling, ear discharge, ear pain and sore throat.   Respiratory: Negative for cough, shortness of breath and wheezing.   Cardiovascular: Negative for chest pain, palpitations and leg swelling.  Gastrointestinal: Positive for nausea and vomiting. Negative for abdominal pain, blood in stool, constipation and diarrhea.  Endocrine: Negative for polydipsia.  Genitourinary: Negative for dysuria, frequency, hematuria and urgency.  Musculoskeletal: Negative for arthralgias, back pain, myalgias and neck pain.  Skin: Negative for rash.  Allergic/Immunologic: Negative for environmental allergies.  Neurological: Negative for dizziness and headaches.  Hematological: Does not bruise/bleed easily.  Psychiatric/Behavioral: Negative for suicidal ideas. The patient is not nervous/anxious.     There are no active problems to display for this patient.   No Known Allergies  Past Surgical History:  Procedure Laterality Date  . BACK SURGERY    . COLONOSCOPY  2016   cleared for 10 yrs- Dr Bluford Kaufmann  . NASAL SEPTUM SURGERY    . TONSILLECTOMY      Social History   Tobacco Use  . Smoking status: Former Games developer  . Smokeless tobacco: Never Used  Substance Use Topics  . Alcohol use: Yes    Alcohol/week: 0.0 standard drinks  . Drug use: No      Medication list has been reviewed and updated.  Current Meds  Medication Sig  . escitalopram (LEXAPRO) 20 MG tablet Take 1 tablet (20 mg total) by mouth daily.  . [DISCONTINUED] gabapentin (NEURONTIN) 300 MG capsule Take 1 capsule by mouth 3 (three) times daily.    PHQ 2/9 Scores 10/25/2017  PHQ - 2 Score 0  PHQ- 9 Score 0    Physical Exam  Constitutional: He is oriented to person, place, and time.  HENT:  Head: Normocephalic.  Right Ear: External ear normal.  Left Ear: External ear normal.  Nose: Nose normal.  Mouth/Throat: Oropharynx is clear and moist.  Eyes: Pupils are equal, round, and reactive to light. Conjunctivae and EOM are normal. Right eye exhibits no discharge. Left eye exhibits no discharge. No scleral icterus.  Neck: Normal range of motion. Neck supple. No JVD present. No tracheal deviation present. No thyromegaly present.  Cardiovascular: Normal rate, regular rhythm, normal heart sounds and intact distal pulses. Exam reveals no gallop and no friction rub.  No murmur heard. Pulmonary/Chest: Breath sounds normal. No respiratory distress. He has no wheezes. He has no rales.  Abdominal: Soft. Bowel sounds are normal. He exhibits no mass. There is no hepatosplenomegaly. There is no tenderness. There is no rebound, no guarding and no CVA tenderness.  Musculoskeletal: Normal range of motion. He exhibits no edema or tenderness.  Lymphadenopathy:    He has no cervical adenopathy.  Neurological: He is alert and oriented to person, place, and time. He has normal strength and normal reflexes. No cranial nerve deficit.  Skin: Skin is  warm. No rash noted.  Nursing note and vitals reviewed.   BP 120/78   Pulse 60   Ht 5\' 10"  (1.778 m)   Wt 202 lb 12.8 oz (92 kg)   BMI 29.10 kg/m   Assessment and Plan:  1. Nausea Suspect due to dosage of gabapentin 300mg  tid. Will decrease to 100 mg tid and determine tolerance. Will cover with zofran  As needed. - gabapentin  (NEURONTIN) 100 MG capsule; Take 1 capsule (100 mg total) by mouth 3 (three) times daily.  Dispense: 30 capsule; Refill: 0 - ondansetron (ZOFRAN) 4 MG tablet; Take 1 tablet (4 mg total) by mouth every 8 (eight) hours as needed for nausea or vomiting.  Dispense: 10 tablet; Refill: 0   Dr. Hayden [facility]  02/24/2018

## 2018-04-25 ENCOUNTER — Other Ambulatory Visit: Payer: Self-pay

## 2018-04-25 MED ORDER — ESCITALOPRAM OXALATE 20 MG PO TABS: 20.0000 mg | ORAL_TABLET | Freq: Every day | ORAL | 0 refills | Status: DC

## 2018-05-06 ENCOUNTER — Ambulatory Visit (INDEPENDENT_AMBULATORY_CARE_PROVIDER_SITE_OTHER): Payer: 59 | Admitting: Family Medicine

## 2018-05-06 ENCOUNTER — Encounter: Payer: Self-pay | Admitting: Family Medicine

## 2018-05-06 VITALS — BP 130/70 | HR 76 | Ht 70.0 in | Wt 201.0 lb

## 2018-05-06 DIAGNOSIS — F419 Anxiety disorder, unspecified: Secondary | ICD-10-CM

## 2018-05-06 DIAGNOSIS — Z23 Encounter for immunization: Secondary | ICD-10-CM | POA: Diagnosis not present

## 2018-05-06 MED ORDER — ESCITALOPRAM OXALATE 20 MG PO TABS: 20.0000 mg | ORAL_TABLET | Freq: Every day | ORAL | 1 refills | Status: DC

## 2018-05-06 NOTE — Progress Notes (Signed)
Date:  05/06/2018   Name:  Derek Blake   DOB:  03-Apr-1961   MRN:  [id number]   Chief Complaint: Depression (refill lexapro)  Depression         Associated symptoms include no decreased concentration, does not have insomnia, no restlessness, no myalgias, no headaches and no suicidal ideas.  Past medical history includes anxiety.   Anxiety  Presents for follow-up visit. Symptoms include excessive worry and nervous/anxious behavior. Patient reports no chest pain, decreased concentration, dizziness, feeling of choking, hyperventilation, impotence, insomnia, irritability, nausea, obsessions, palpitations, restlessness, shortness of breath or suicidal ideas. The severity of symptoms is mild. The quality of sleep is good.      Review of Systems  Constitutional: Negative for chills, fever and irritability.  HENT: Negative for drooling, ear discharge, ear pain and sore throat.   Respiratory: Negative for cough, shortness of breath and wheezing.   Cardiovascular: Negative for chest pain, palpitations and leg swelling.  Gastrointestinal: Negative for abdominal pain, blood in stool, constipation, diarrhea and nausea.  Endocrine: Negative for polydipsia.  Genitourinary: Negative for dysuria, frequency, hematuria, impotence and urgency.  Musculoskeletal: Negative for back pain, myalgias and neck pain.  Skin: Negative for rash.  Allergic/Immunologic: Negative for environmental allergies.  Neurological: Negative for dizziness and headaches.  Hematological: Does not bruise/bleed easily.  Psychiatric/Behavioral: Positive for depression. Negative for decreased concentration and suicidal ideas. The patient is nervous/anxious. The patient does not have insomnia.     There are no active problems to display for this patient.   No Known Allergies  Past Surgical History:  Procedure Laterality Date  . BACK SURGERY    . COLONOSCOPY  2016   cleared for 10 yrs- Dr Bluford Kaufmannh  . NASAL SEPTUM SURGERY      . TONSILLECTOMY      Social History   Tobacco Use  . Smoking status: Former Games developermoker  . Smokeless tobacco: Never Used  Substance Use Topics  . Alcohol use: Yes    Alcohol/week: 0.0 standard drinks  . Drug use: No     Medication list has been reviewed and updated.  Current Meds  Medication Sig  . escitalopram (LEXAPRO) 20 MG tablet Take 1 tablet (20 mg total) by mouth daily.    PHQ 2/9 Scores 05/06/2018 10/25/2017  PHQ - 2 Score 0 0  PHQ- 9 Score 0 0    Physical Exam Nursing note reviewed. Exam conducted with a chaperone present.  HENT:     Head: Normocephalic.     Right Ear: External ear normal.     Left Ear: External ear normal.     Nose: Nose normal.  Eyes:     General: No scleral icterus.       Right eye: No discharge.        Left eye: No discharge.     Conjunctiva/sclera: Conjunctivae normal.     Pupils: Pupils are equal, round, and reactive to light.  Neck:     Musculoskeletal: Normal range of motion and neck supple.     Thyroid: No thyromegaly.     Vascular: No JVD.     Trachea: No tracheal deviation.  Cardiovascular:     Rate and Rhythm: Normal rate and regular rhythm.     Heart sounds: Normal heart sounds. No murmur. No friction rub. No gallop.   Pulmonary:     Effort: No respiratory distress.     Breath sounds: Normal breath sounds. No wheezing or rales.  Abdominal:  General: Bowel sounds are normal.     Palpations: Abdomen is soft. There is no mass.     Tenderness: There is no abdominal tenderness. There is no guarding or rebound.  Musculoskeletal: Normal range of motion.        General: No tenderness.  Lymphadenopathy:     Cervical: No cervical adenopathy.  Skin:    General: Skin is warm.     Findings: No rash.  Neurological:     Mental Status: He is alert and oriented to person, place, and time.     Cranial Nerves: No cranial nerve deficit.     Deep Tendon Reflexes: Reflexes are normal and symmetric.     BP 130/70   Pulse 76   Ht 5'  10" (1.778 m)   Wt 201 lb (91.2 kg)   BMI 28.84 kg/m   Assessment and Plan: 1. Anxiety Chronic, stable- refill lexapro - escitalopram (LEXAPRO) 20 MG tablet; Take 1 tablet (20 mg total) by mouth daily.  Dispense: 90 tablet; Refill: 1  2. Flu vaccine need Discussed and administered - Flu Vaccine QUAD 6+ mos PF IM (Fluarix Quad PF)

## 2018-07-18 ENCOUNTER — Other Ambulatory Visit: Payer: Self-pay

## 2018-07-18 DIAGNOSIS — E663 Overweight: Secondary | ICD-10-CM | POA: Insufficient documentation

## 2018-07-18 DIAGNOSIS — F419 Anxiety disorder, unspecified: Secondary | ICD-10-CM

## 2018-07-18 MED ORDER — ESCITALOPRAM OXALATE 20 MG PO TABS: 20.0000 mg | ORAL_TABLET | Freq: Every day | ORAL | 1 refills | Status: DC

## 2019-02-14 ENCOUNTER — Ambulatory Visit: Payer: 59 | Admitting: Family Medicine

## 2019-02-14 ENCOUNTER — Encounter: Payer: Self-pay | Admitting: Family Medicine

## 2019-02-14 ENCOUNTER — Other Ambulatory Visit: Payer: Self-pay

## 2019-02-14 VITALS — BP 120/80 | HR 64 | Ht 70.0 in | Wt 197.0 lb

## 2019-02-14 DIAGNOSIS — F419 Anxiety disorder, unspecified: Secondary | ICD-10-CM | POA: Diagnosis not present

## 2019-02-14 DIAGNOSIS — Z23 Encounter for immunization: Secondary | ICD-10-CM | POA: Diagnosis not present

## 2019-02-14 DIAGNOSIS — S29011A Strain of muscle and tendon of front wall of thorax, initial encounter: Secondary | ICD-10-CM | POA: Diagnosis not present

## 2019-02-14 MED ORDER — ESCITALOPRAM OXALATE 20 MG PO TABS: 20.0000 mg | ORAL_TABLET | Freq: Every day | ORAL | 1 refills | Status: DC

## 2019-02-14 MED ORDER — MELOXICAM 15 MG PO TABS: 15.0000 mg | ORAL_TABLET | Freq: Every day | ORAL | 0 refills | Status: DC
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
## 2019-03-14 ENCOUNTER — Other Ambulatory Visit: Payer: Self-pay | Admitting: Family Medicine

## 2019-03-14 DIAGNOSIS — S29011A Strain of muscle and tendon of front wall of thorax, initial encounter: Secondary | ICD-10-CM

## 2019-10-30 ENCOUNTER — Encounter: Payer: Self-pay | Admitting: Family Medicine

## 2019-10-30 ENCOUNTER — Ambulatory Visit: Payer: 59 | Admitting: Family Medicine

## 2019-10-30 ENCOUNTER — Other Ambulatory Visit: Payer: Self-pay

## 2019-10-30 VITALS — BP 130/80 | HR 68 | Ht 70.0 in | Wt 188.0 lb

## 2019-10-30 DIAGNOSIS — R110 Nausea: Secondary | ICD-10-CM | POA: Diagnosis not present

## 2019-10-30 DIAGNOSIS — R61 Generalized hyperhidrosis: Secondary | ICD-10-CM

## 2019-10-30 DIAGNOSIS — R5383 Other fatigue: Secondary | ICD-10-CM

## 2019-10-30 DIAGNOSIS — R634 Abnormal weight loss: Secondary | ICD-10-CM | POA: Diagnosis not present

## 2019-10-30 DIAGNOSIS — R5381 Other malaise: Secondary | ICD-10-CM

## 2019-10-30 LAB — GLUCOSE, POCT (MANUAL RESULT ENTRY): POC Glucose: 118 mg/dl — AB (ref 70–99)

## 2019-10-30 NOTE — Progress Notes (Signed)
Date:  10/30/2019   Name:  Derek Blake   DOB:  04/04/1961   MRN:  [id number]   Chief Complaint: Weight Loss (gets jittery and nauseated when doesn't eat. Feeling light-headed. )  Emesis  This is a recurrent (nausea not vomiting) problem. The current episode started more than 1 year ago (10/19). The problem occurs intermittently. The problem has been unchanged. There has been no fever. Associated symptoms include chills, dizziness, sweats and weight loss. Pertinent negatives include no abdominal pain, arthralgias, chest pain, coughing, diarrhea, fever, headaches, myalgias or URI. Treatments tried: snickers. The treatment provided mild relief.    No results found for: CREATININE, BUN, NA, K, CL, CO2 Lab Results  Component Value Date   CHOL 167 03/02/2016   HDL 32 (L) 03/02/2016   LDLCALC 65 03/02/2016   TRIG 351 (H) 03/02/2016   CHOLHDL 5.2 (H) 03/02/2016   No results found for: TSH No results found for: HGBA1C No results found for: WBC, HGB, HCT, MCV, PLT No results found for: ALT, AST, GGT, ALKPHOS, BILITOT   Review of Systems  Constitutional: Positive for chills, diaphoresis, fatigue, unexpected weight change and weight loss. Negative for fever.  HENT: Negative for drooling, ear discharge, ear pain, postnasal drip, sinus pain and sore throat.   Respiratory: Negative for cough, shortness of breath and wheezing.   Cardiovascular: Negative for chest pain, palpitations and leg swelling.  Gastrointestinal: Positive for vomiting. Negative for abdominal pain, blood in stool, constipation, diarrhea and nausea.  Endocrine: Negative for polydipsia.  Genitourinary: Negative for dysuria, frequency, hematuria and urgency.  Musculoskeletal: Negative for arthralgias, back pain, myalgias and neck pain.  Skin: Negative for pallor and rash.  Allergic/Immunologic: Negative for environmental allergies.  Neurological: Positive for dizziness. Negative for headaches.  Hematological: Does not  bruise/bleed easily.  Psychiatric/Behavioral: Negative for suicidal ideas. The patient is not nervous/anxious.     Patient Active Problem List   Diagnosis Date Noted  . Anxiety 02/14/2019    No Known Allergies  Past Surgical History:  Procedure Laterality Date  . BACK SURGERY    . COLONOSCOPY  2016   cleared for 10 yrs- Dr Bluford Kaufmann  . NASAL SEPTUM SURGERY    . TONSILLECTOMY      Social History   Tobacco Use  . Smoking status: Former Games developer  . Smokeless tobacco: Never Used  Substance Use Topics  . Alcohol use: Yes    Alcohol/week: 0.0 standard drinks  . Drug use: No     Medication list has been reviewed and updated.  Current Meds  Medication Sig  . escitalopram (LEXAPRO) 20 MG tablet Take 1 tablet (20 mg total) by mouth daily.  . [DISCONTINUED] meloxicam (MOBIC) 15 MG tablet TAKE 1 TABLET BY MOUTH EVERY DAY    PHQ 2/9 Scores 10/30/2019 02/14/2019 05/06/2018 10/25/2017  PHQ - 2 Score 0 0 0 0  PHQ- 9 Score 1 0 0 0    BP Readings from Last 3 Encounters:  10/30/19 130/80  02/14/19 120/80  05/06/18 130/70    Physical Exam Vitals and nursing note reviewed.  HENT:     Head: Normocephalic.     Right Ear: Tympanic membrane, ear canal and external ear normal. There is no impacted cerumen.     Left Ear: Tympanic membrane, ear canal and external ear normal. There is no impacted cerumen.     Nose: Nose normal. No congestion or rhinorrhea.     Mouth/Throat:     Mouth: Mucous membranes are moist.  Eyes:     General: No scleral icterus.       Right eye: No discharge.        Left eye: No discharge.     Conjunctiva/sclera: Conjunctivae normal.     Pupils: Pupils are equal, round, and reactive to light.  Neck:     Thyroid: No thyromegaly.     Vascular: No JVD.     Trachea: No tracheal deviation.  Cardiovascular:     Rate and Rhythm: Normal rate and regular rhythm.     Heart sounds: Normal heart sounds. No murmur. No friction rub. No gallop.   Pulmonary:     Effort: No  respiratory distress.     Breath sounds: Normal breath sounds. No wheezing, rhonchi or rales.  Abdominal:     General: Bowel sounds are normal.     Palpations: Abdomen is soft. There is no mass.     Tenderness: There is no abdominal tenderness. There is no guarding or rebound.  Musculoskeletal:        General: No tenderness. Normal range of motion.     Cervical back: Normal range of motion and neck supple.  Lymphadenopathy:     Cervical: No cervical adenopathy.  Skin:    General: Skin is warm.     Capillary Refill: Capillary refill takes less than 2 seconds.     Findings: No rash.  Neurological:     Mental Status: He is alert and oriented to person, place, and time.     Cranial Nerves: No cranial nerve deficit.     Deep Tendon Reflexes: Reflexes are normal and symmetric.     Wt Readings from Last 3 Encounters:  10/30/19 188 lb (85.3 kg)  02/14/19 197 lb (89.4 kg)  05/06/18 201 lb (91.2 kg)    BP 130/80   Pulse 68   Ht 5\' 10"  (1.778 m)   Wt 188 lb (85.3 kg)   BMI 26.98 kg/m   Assessment and Plan: 1. Malaise and fatigue Patient has had decreased malaise and fatigue for the past several weeks.  This began about the time that he had his back surgery.  We will initiate an evaluation there about looking at CBC, renal function panel, A1c, and hepatic function panel.  We will also look at a TSH with indices to determine whether there may be a hyperthyroid condition as well as the calcium and phosphorus to gauge of a possible hyperparathyroid concern.  2. Weight loss New onset.  Persistent.  Gradually decreasing.  As noted above lab work including CBC, renal function, A1c, hepatic function, and thyroid were obtained for evaluation.  Patient is to return in 2 weeks for reevaluation. - CBC with Differential/Platelet - Renal Function Panel - Hemoglobin A1c - Hepatic Function Panel (6) - Thyroid Panel With TSH  3. Nausea Nausea has been persistent and patient has had recent weight  loss.  We will check an H. pylori but we will likely refer to GI given the persistent of this nausea. - H. pylori antibody, IgG  4. Diaphoresis Patient's had condition to unintentional weight loss there has been some diaphoresis..  We will further evaluate with lab work and recheck in 2 weeks if further evaluation is needed with imaging.

## 2019-10-30 NOTE — Addendum Note (Signed)
Addended by: Everitt Amber on: 10/30/2019 04:06 PM   Modules accepted: Orders

## 2019-10-31 LAB — CBC WITH DIFFERENTIAL/PLATELET
Basophils Absolute: 0.1 10*3/uL (ref 0.0–0.2)
Basos: 1 %
EOS (ABSOLUTE): 0 10*3/uL (ref 0.0–0.4)
Eos: 1 %
Hematocrit: 46.9 % (ref 37.5–51.0)
Hemoglobin: 16.2 g/dL (ref 13.0–17.7)
Immature Grans (Abs): 0.1 10*3/uL (ref 0.0–0.1)
Immature Granulocytes: 1 %
Lymphocytes Absolute: 1.9 10*3/uL (ref 0.7–3.1)
Lymphs: 26 %
MCH: 33.1 pg — ABNORMAL HIGH (ref 26.6–33.0)
MCHC: 34.5 g/dL (ref 31.5–35.7)
MCV: 96 fL (ref 79–97)
Monocytes Absolute: 0.5 10*3/uL (ref 0.1–0.9)
Monocytes: 7 %
Neutrophils Absolute: 4.7 10*3/uL (ref 1.4–7.0)
Neutrophils: 64 %
Platelets: 279 10*3/uL (ref 150–450)
RBC: 4.9 x10E6/uL (ref 4.14–5.80)
RDW: 12.1 % (ref 11.6–15.4)
WBC: 7.3 10*3/uL (ref 3.4–10.8)

## 2019-10-31 LAB — THYROID PANEL WITH TSH
Free Thyroxine Index: 1.8 (ref 1.2–4.9)
T3 Uptake Ratio: 27 % (ref 24–39)
T4, Total: 6.7 ug/dL (ref 4.5–12.0)
TSH: 2.38 u[IU]/mL (ref 0.450–4.500)

## 2019-10-31 LAB — RENAL FUNCTION PANEL
Albumin: 4.5 g/dL (ref 3.8–4.9)
BUN/Creatinine Ratio: 8 — ABNORMAL LOW (ref 9–20)
BUN: 10 mg/dL (ref 6–24)
CO2: 25 mmol/L (ref 20–29)
Calcium: 9.5 mg/dL (ref 8.7–10.2)
Chloride: 97 mmol/L (ref 96–106)
Creatinine, Ser: 1.18 mg/dL (ref 0.76–1.27)
GFR calc Af Amer: 79 mL/min/{1.73_m2} (ref >59)
GFR calc non Af Amer: 68 mL/min/{1.73_m2} (ref >59)
Glucose: 100 mg/dL — ABNORMAL HIGH (ref 65–99)
Phosphorus: 3.7 mg/dL (ref 2.8–4.1)
Potassium: 4.4 mmol/L (ref 3.5–5.2)
Sodium: 136 mmol/L (ref 134–144)

## 2019-10-31 LAB — H. PYLORI ANTIBODY, IGG: H. pylori, IgG AbS: 0.23 Index Value (ref 0.00–0.79)

## 2019-10-31 LAB — HEMOGLOBIN A1C
Est. average glucose Bld gHb Est-mCnc: 111 mg/dL
Hgb A1c MFr Bld: 5.5 % (ref 4.8–5.6)

## 2019-10-31 LAB — HEPATIC FUNCTION PANEL (6)
ALT: 17 IU/L (ref 0–44)
AST: 20 IU/L (ref 0–40)
Alkaline Phosphatase: 89 IU/L (ref 48–121)
Bilirubin Total: 0.7 mg/dL (ref 0.0–1.2)
Bilirubin, Direct: 0.19 mg/dL (ref 0.00–0.40)

## 2019-11-14 ENCOUNTER — Ambulatory Visit: Payer: 59 | Admitting: Family Medicine

## 2019-11-14 ENCOUNTER — Encounter: Payer: Self-pay | Admitting: Family Medicine

## 2019-11-14 ENCOUNTER — Other Ambulatory Visit: Payer: Self-pay

## 2019-11-14 VITALS — BP 120/70 | HR 88 | Ht 70.0 in | Wt 194.0 lb

## 2019-11-14 DIAGNOSIS — L723 Sebaceous cyst: Secondary | ICD-10-CM

## 2019-11-14 NOTE — Progress Notes (Signed)
Date:  11/14/2019   Name:  Derek Blake   DOB:  [birth year]   MRN:  [id number]   Chief Complaint: knot in armpit (R) armpit- started small and is getting larger)  Patient is a 59 year old male who presents for a axillary  exam. The patient reports the following problems: sebaceous cyst. Health maintenance has been reviewed up to date.    Lab Results  Component Value Date   CREATININE 1.18 10/30/2019   BUN 10 10/30/2019   NA 136 10/30/2019   K 4.4 10/30/2019   CL 97 10/30/2019   CO2 25 10/30/2019   Lab Results  Component Value Date   CHOL 167 03/02/2016   HDL 32 (L) 03/02/2016   LDLCALC 65 03/02/2016   TRIG 351 (H) 03/02/2016   CHOLHDL 5.2 (H) 03/02/2016   Lab Results  Component Value Date   TSH 2.380 10/30/2019   Lab Results  Component Value Date   HGBA1C 5.5 10/30/2019   Lab Results  Component Value Date   WBC 7.3 10/30/2019   HGB 16.2 10/30/2019   HCT 46.9 10/30/2019   MCV 96 10/30/2019   PLT 279 10/30/2019   Lab Results  Component Value Date   ALT 17 10/30/2019   AST 20 10/30/2019   ALKPHOS 89 10/30/2019   BILITOT 0.7 10/30/2019     Review of Systems  Constitutional: Negative for chills and fever.  HENT: Negative for drooling, ear discharge, ear pain and sore throat.   Respiratory: Negative for cough, shortness of breath and wheezing.   Cardiovascular: Negative for chest pain, palpitations and leg swelling.  Gastrointestinal: Negative for abdominal pain, blood in stool, constipation, diarrhea and nausea.  Endocrine: Negative for polydipsia.  Genitourinary: Negative for dysuria, frequency, hematuria and urgency.  Musculoskeletal: Negative for back pain, myalgias and neck pain.  Skin: Negative for rash.  Allergic/Immunologic: Negative for environmental allergies.  Neurological: Negative for dizziness and headaches.  Hematological: Does not bruise/bleed easily.  Psychiatric/Behavioral: Negative for suicidal ideas. The patient is not  nervous/anxious.     Patient Active Problem List   Diagnosis Date Noted  . Anxiety 02/14/2019    No Known Allergies  Past Surgical History:  Procedure Laterality Date  . BACK SURGERY    . COLONOSCOPY  2016   cleared for 10 yrs- Dr Candace Cruise  . NASAL SEPTUM SURGERY    . TONSILLECTOMY      Social History   Tobacco Use  . Smoking status: Former Research scientist (life sciences)  . Smokeless tobacco: Never Used  Substance Use Topics  . Alcohol use: Yes    Alcohol/week: 0.0 standard drinks  . Drug use: No     Medication list has been reviewed and updated.  Current Meds  Medication Sig  . escitalopram (LEXAPRO) 20 MG tablet Take 1 tablet (20 mg total) by mouth daily.    PHQ 2/9 Scores 11/14/2019 10/30/2019 02/14/2019 05/06/2018  PHQ - 2 Score 0 0 0 0  PHQ- 9 Score 0 1 0 0    GAD 7 : Generalized Anxiety Score 11/14/2019 10/30/2019 02/14/2019  Nervous, Anxious, on Edge 0 2 0  Control/stop worrying 0 0 0  Worry too much - different things 0 0 0  Trouble relaxing 0 0 0  Restless 0 0 0  Easily annoyed or irritable 0 0 0  Afraid - awful might happen 0 0 0  Total GAD 7 Score 0 2 0  Anxiety Difficulty - Not difficult at all -    BP  Readings from Last 3 Encounters:  11/14/19 120/70  10/30/19 130/80  02/14/19 120/80    Physical Exam Vitals and nursing note reviewed.  Lymphadenopathy:     Head:     Right side of head: No submandibular or tonsillar adenopathy.     Left side of head: No submandibular or tonsillar adenopathy.     Cervical:     Right cervical: No superficial, deep or posterior cervical adenopathy.    Left cervical: No superficial, deep or posterior cervical adenopathy.     Upper Body:     Right upper body: No supraclavicular or axillary adenopathy.     Left upper body: No supraclavicular or axillary adenopathy.     Wt Readings from Last 3 Encounters:  11/14/19 194 lb (88 kg)  10/30/19 188 lb (85.3 kg)  02/14/19 197 lb (89.4 kg)    BP 120/70   Pulse 88   Ht 5\' 10"  (1.778 m)   Wt  194 lb (88 kg)   BMI 27.84 kg/m   Assessment and Plan:  1. Sebaceous cyst of right axilla New onset. Stable. This is palpable in the dermis with mild tenderness a area that is about 1 cm in diameter consistent with a sebaceous cyst. I do not see an area where there is drainage but this is deep enough and may be amenable to an I&D procedure. There is no palpable adenopathy beneath it and there is no palpable adenopathy noted in the areas on the physical exam. Hepatic and spleen exam was unremarkable for enlargement. It was noted that weight loss is stabilized and patient has in fact gained 5 pounds. We will refer to dermatology for evaluation and possible I&D of the small sebaceous cyst. - Ambulatory referral to Dermatology

## 2019-11-16 ENCOUNTER — Other Ambulatory Visit: Payer: Self-pay | Admitting: Family Medicine

## 2019-11-16 DIAGNOSIS — F419 Anxiety disorder, unspecified: Secondary | ICD-10-CM

## 2019-11-16 NOTE — Telephone Encounter (Signed)
Requested  medications are  due for refill today yes  Requested medications are on the active medication list yes  Last refill 3/25  Future visit scheduled no  Last visit 9 months ago  Notes to clinic failed protocol due to no visit within 6 months

## 2020-06-29 ENCOUNTER — Other Ambulatory Visit: Payer: Self-pay | Admitting: Family Medicine

## 2020-06-29 ENCOUNTER — Encounter: Payer: Self-pay | Admitting: *Deleted

## 2020-06-29 DIAGNOSIS — F419 Anxiety disorder, unspecified: Secondary | ICD-10-CM

## 2020-06-29 NOTE — Telephone Encounter (Signed)
MyChart message sent to advise patient to schedule an appointment. Courtesy refill provided.

## 2020-07-13 ENCOUNTER — Other Ambulatory Visit: Payer: Self-pay | Admitting: Family Medicine

## 2020-07-13 DIAGNOSIS — F419 Anxiety disorder, unspecified: Secondary | ICD-10-CM

## 2020-08-19 ENCOUNTER — Other Ambulatory Visit: Payer: Self-pay | Admitting: Family Medicine

## 2020-08-19 DIAGNOSIS — F419 Anxiety disorder, unspecified: Secondary | ICD-10-CM

## 2020-08-19 NOTE — Telephone Encounter (Signed)
Medication Refill - Medication: escitalopram (LEXAPRO) 20 MG tablet Pt asked for a call to get a ball park time frame for this request/ Pt states he usually speaks with Delice Bison  Has the patient contacted their pharmacy? Yes.   (Agent: If no, request that the patient contact the pharmacy for the refill.) (Agent: If yes, when and what did the pharmacy advise?)  Preferred Pharmacy (with phone number or street name): [facility] [phone] Dan [facility]re[facility], [place] - [address]  [address] [place], [place] [address]  Phone:  [phone] Fax:  [phone]   Agent: Please be advised that RX refills may take up to 3 business days. We ask that you follow-up with your pharmacy.

## 2020-08-19 NOTE — Telephone Encounter (Signed)
   Notes to clinic: Patient has been schedule for a follow up appointment for tomorrow Patient states that he is okay to wait until appt tomorrow for medication    Requested Prescriptions  Pending Prescriptions Disp Refills   escitalopram (LEXAPRO) 20 MG tablet 30 tablet 0    Sig: Take 1 tablet (20 mg total) by mouth daily.      Psychiatry:  Antidepressants - SSRI Failed - 08/19/2020  1:39 PM      Failed - Valid encounter within last 6 months    Recent Outpatient Visits           9 months ago Sebaceous cyst of right axilla   [facility] Duanne Limerick, MD   9 months ago Malaise and fatigue   [facility] Duanne Limerick, MD   1 year ago Anxiety   [facility] Duanne Limerick, MD   2 years ago Anxiety   [facility] Duanne Limerick, MD   2 years ago Nausea   [facility] Duanne Limerick, MD       Future Appointments             Tomorrow Duanne Limerick, MD [facility], [facility]

## 2020-08-20 ENCOUNTER — Ambulatory Visit: Payer: Self-pay | Admitting: Family Medicine

## 2020-08-22 ENCOUNTER — Ambulatory Visit: Payer: Self-pay | Admitting: Family Medicine

## 2020-08-23 ENCOUNTER — Other Ambulatory Visit: Payer: Self-pay

## 2020-08-23 ENCOUNTER — Ambulatory Visit: Payer: 59 | Admitting: Family Medicine

## 2020-08-23 ENCOUNTER — Encounter: Payer: Self-pay | Admitting: Family Medicine

## 2020-08-23 VITALS — BP 130/90 | HR 76 | Ht 70.0 in | Wt 191.0 lb

## 2020-08-23 DIAGNOSIS — F419 Anxiety disorder, unspecified: Secondary | ICD-10-CM

## 2020-08-23 DIAGNOSIS — F5101 Primary insomnia: Secondary | ICD-10-CM

## 2020-08-23 DIAGNOSIS — F329 Major depressive disorder, single episode, unspecified: Secondary | ICD-10-CM

## 2020-08-23 MED ORDER — ALPRAZOLAM 0.25 MG PO TABS: 0.2500 mg | ORAL_TABLET | Freq: Two times a day (BID) | ORAL | 1 refills | Status: DC | PRN

## 2020-08-23 MED ORDER — TRAZODONE HCL 50 MG PO TABS
25.0000 mg | ORAL_TABLET | Freq: Every evening | ORAL | 3 refills | Status: DC | PRN
Start: 2020-08-23 — End: 2021-03-20

## 2020-08-23 NOTE — Progress Notes (Signed)
Date:  08/23/2020   Name:  Derek Blake   DOB:  [birth year]   MRN:  [id number]   Chief Complaint: Depression (5 and 12) and Anxiety  Depression        This is a chronic problem.  The current episode started more than 1 year ago.   The problem has been gradually worsening since onset.  Associated symptoms include no decreased concentration, no fatigue, no helplessness, no hopelessness, does not have insomnia, not irritable, no restlessness, no decreased interest, no appetite change, no body aches, no myalgias, no headaches, no indigestion, not sad and no suicidal ideas.  Past treatments include SSRIs - Selective serotonin reuptake inhibitors.  Previous treatment provided moderate relief.  Past medical history includes anxiety.   Anxiety Patient reports no chest pain, decreased concentration, dizziness, insomnia, nausea, nervous/anxious behavior, palpitations, restlessness, shortness of breath or suicidal ideas.      Lab Results  Component Value Date   CREATININE 1.18 10/30/2019   BUN 10 10/30/2019   NA 136 10/30/2019   K 4.4 10/30/2019   CL 97 10/30/2019   CO2 25 10/30/2019   Lab Results  Component Value Date   CHOL 167 03/02/2016   HDL 32 (L) 03/02/2016   LDLCALC 65 03/02/2016   TRIG 351 (H) 03/02/2016   CHOLHDL 5.2 (H) 03/02/2016   Lab Results  Component Value Date   TSH 2.380 10/30/2019   Lab Results  Component Value Date   HGBA1C 5.5 10/30/2019   Lab Results  Component Value Date   WBC 7.3 10/30/2019   HGB 16.2 10/30/2019   HCT 46.9 10/30/2019   MCV 96 10/30/2019   PLT 279 10/30/2019   Lab Results  Component Value Date   ALT 17 10/30/2019   AST 20 10/30/2019   ALKPHOS 89 10/30/2019   BILITOT 0.7 10/30/2019     Review of Systems  Constitutional: Negative for appetite change, chills, fatigue and fever.  HENT: Negative for drooling, ear discharge, ear pain and sore throat.   Respiratory: Negative for cough, shortness of breath and wheezing.    Cardiovascular: Negative for chest pain, palpitations and leg swelling.  Gastrointestinal: Negative for abdominal pain, blood in stool, constipation, diarrhea and nausea.  Endocrine: Negative for polydipsia.  Genitourinary: Negative for dysuria, frequency, hematuria and urgency.  Musculoskeletal: Negative for back pain, myalgias and neck pain.  Skin: Negative for rash.  Allergic/Immunologic: Negative for environmental allergies.  Neurological: Negative for dizziness and headaches.  Hematological: Does not bruise/bleed easily.  Psychiatric/Behavioral: Positive for depression. Negative for decreased concentration and suicidal ideas. The patient is not nervous/anxious and does not have insomnia.     Patient Active Problem List   Diagnosis Date Noted  . Anxiety 02/14/2019    No Known Allergies  Past Surgical History:  Procedure Laterality Date  . BACK SURGERY    . COLONOSCOPY  2016   cleared for 10 yrs- Dr Bluford Kaufmann  . NASAL SEPTUM SURGERY    . TONSILLECTOMY      Social History   Tobacco Use  . Smoking status: Former Games developer  . Smokeless tobacco: Never Used  Substance Use Topics  . Alcohol use: Yes    Alcohol/week: 0.0 standard drinks  . Drug use: No     Medication list has been reviewed and updated.  Current Meds  Medication Sig  . escitalopram (LEXAPRO) 20 MG tablet TAKE 1 TABLET BY MOUTH EVERY DAY    PHQ 2/9 Scores 08/23/2020 11/14/2019 10/30/2019 02/14/2019  PHQ -  2 Score 2 0 0 0  PHQ- 9 Score 5 0 1 0    GAD 7 : Generalized Anxiety Score 08/23/2020 11/14/2019 10/30/2019 02/14/2019  Nervous, Anxious, on Edge 0 0 2 0  Control/stop worrying 3 0 0 0  Worry too much - different things 3 0 0 0  Trouble relaxing 2 0 0 0  Restless 0 0 0 0  Easily annoyed or irritable 1 0 0 0  Afraid - awful might happen 3 0 0 0  Total GAD 7 Score 12 0 2 0  Anxiety Difficulty Somewhat difficult - Not difficult at all -    BP Readings from Last 3 Encounters:  08/23/20 130/90  11/14/19 120/70   10/30/19 130/80    Physical Exam Vitals and nursing note reviewed.  Constitutional:      General: He is not irritable. HENT:     Head: Normocephalic.     Right Ear: Tympanic membrane and external ear normal.     Left Ear: Tympanic membrane and external ear normal.     Nose: Nose normal. No congestion or rhinorrhea.     Mouth/Throat:     Mouth: Mucous membranes are moist.  Eyes:     General: No scleral icterus.       Right eye: No discharge.        Left eye: No discharge.     Conjunctiva/sclera: Conjunctivae normal.     Pupils: Pupils are equal, round, and reactive to light.  Neck:     Thyroid: No thyromegaly.     Vascular: No JVD.     Trachea: No tracheal deviation.  Cardiovascular:     Rate and Rhythm: Normal rate and regular rhythm.     Heart sounds: Normal heart sounds. No murmur heard. No friction rub. No gallop.   Pulmonary:     Effort: No respiratory distress.     Breath sounds: Normal breath sounds. No wheezing, rhonchi or rales.  Abdominal:     General: Bowel sounds are normal.     Palpations: Abdomen is soft. There is no mass.     Tenderness: There is no abdominal tenderness. There is no guarding or rebound.  Musculoskeletal:        General: No tenderness. Normal range of motion.     Cervical back: Normal range of motion and neck supple.  Lymphadenopathy:     Cervical: No cervical adenopathy.  Skin:    General: Skin is warm.     Findings: No rash.  Neurological:     Mental Status: He is alert and oriented to person, place, and time.     Cranial Nerves: No cranial nerve deficit.     Deep Tendon Reflexes: Reflexes are normal and symmetric.     Wt Readings from Last 3 Encounters:  08/23/20 191 lb (86.6 kg)  11/14/19 194 lb (88 kg)  10/30/19 188 lb (85.3 kg)    BP 130/90   Pulse 76   Ht 5\' 10"  (1.778 m)   Wt 191 lb (86.6 kg)   BMI 27.41 kg/m   Assessment and Plan:  1. Reactive depression Chronic.  Persistent.  PHQ is 5.  Patient's got a  reactive depression due to spouse having metastatic breast cancer.  Patient's been informed to continue his Lexapro at current dosing.  2. Anxiety New onset.  Patient has had increasing anxiety with panic attacks and needs something to take on an as-needed basis.  Patient has been given 30 tablets of alprazolam 0.25 to take  on an as-needed basis. - ALPRAZolam (XANAX) 0.25 MG tablet; Take 1 tablet (0.25 mg total) by mouth 2 (two) times daily as needed for anxiety.  Dispense: 30 tablet; Refill: 1  3. Primary insomnia New onset.  Persistent.  Stable.  We will treat with trazodone 50 mg 1/2 to 1 tablet nightly. - traZODone (DESYREL) 50 MG tablet; Take 0.5-1 tablets (25-50 mg total) by mouth at bedtime as needed for sleep.  Dispense: 30 tablet; Refill: 3

## 2020-09-12 ENCOUNTER — Other Ambulatory Visit: Payer: Self-pay | Admitting: Family Medicine

## 2020-09-12 DIAGNOSIS — F419 Anxiety disorder, unspecified: Secondary | ICD-10-CM

## 2020-09-12 NOTE — Telephone Encounter (Signed)
Requested medication (s) are due for refill today: yes  Requested medication (s) are on the active medication list: yes  Last refill:  08/21/20  Future visit scheduled: no  Notes to clinic:  does Dr Yetta Barre want a follow up appt before next refill    Requested Prescriptions  Pending Prescriptions Disp Refills   escitalopram (LEXAPRO) 20 MG tablet [Pharmacy Med Name: ESCITALOPRAM 20 MG TABLET] 90 tablet 0    Sig: TAKE 1 TABLET BY MOUTH EVERY DAY *NEEDS APPT FOR REFILLS*      Psychiatry:  Antidepressants - SSRI Passed - 09/12/2020  2:30 PM      Passed - Valid encounter within last 6 months    Recent Outpatient Visits           2 weeks ago Reactive depression   [facility] Duanne Limerick, MD   10 months ago Sebaceous cyst of right axilla   [facility] Duanne Limerick, MD   10 months ago Malaise and fatigue   [facility] Duanne Limerick, MD   1 year ago Anxiety   [facility] Duanne Limerick, MD   2 years ago Anxiety   [facility] Duanne Limerick, MD

## 2020-09-14 ENCOUNTER — Other Ambulatory Visit: Payer: Self-pay | Admitting: Family Medicine

## 2020-09-14 DIAGNOSIS — F5101 Primary insomnia: Secondary | ICD-10-CM

## 2020-10-15 ENCOUNTER — Other Ambulatory Visit: Payer: Self-pay | Admitting: Family Medicine

## 2020-10-15 DIAGNOSIS — F5101 Primary insomnia: Secondary | ICD-10-CM

## 2020-12-14 ENCOUNTER — Other Ambulatory Visit: Payer: Self-pay | Admitting: Family Medicine

## 2020-12-14 DIAGNOSIS — F419 Anxiety disorder, unspecified: Secondary | ICD-10-CM

## 2020-12-14 NOTE — Telephone Encounter (Signed)
Requested medication (s) are due for refill today: yes  Requested medication (s) are on the active medication list: yes  Last refill:  09/12/20 #90   Future visit scheduled: no  Notes to clinic:  last OV 08/23/20. Did not note on the script that pt needed an OV. Does pt need to come in for appt? No note in office notes that pt needed to return in 3 months.   Requested Prescriptions  Pending Prescriptions Disp Refills   escitalopram (LEXAPRO) 20 MG tablet [Pharmacy Med Name: ESCITALOPRAM 20 MG TABLET] 90 tablet 0    Sig: TAKE 1 TABLET BY MOUTH EVERY DAY *NEEDS APPT FOR REFILLS*      Psychiatry:  Antidepressants - SSRI Passed - 12/14/2020  9:13 AM      Passed - Valid encounter within last 6 months    Recent Outpatient Visits           3 months ago Reactive depression   [facility] Duanne Limerick, MD   1 year ago Sebaceous cyst of right axilla   [facility] Duanne Limerick, MD   1 year ago Malaise and fatigue   [facility] Duanne Limerick, MD   1 year ago Anxiety   [facility] Duanne Limerick, MD   2 years ago Anxiety   [facility] Duanne Limerick, MD

## 2021-03-20 ENCOUNTER — Ambulatory Visit
Admission: RE | Admit: 2021-03-20 | Discharge: 2021-03-20 | Disposition: A | Discharge status: Home / Self Care | Payer: Self-pay | Source: Ambulatory Visit | Attending: Family Medicine | Admitting: Family Medicine

## 2021-03-20 ENCOUNTER — Ambulatory Visit: Payer: No Typology Code available for payment source | Admitting: Family Medicine

## 2021-03-20 ENCOUNTER — Ambulatory Visit
Admission: RE | Admit: 2021-03-20 | Discharge: 2021-03-20 | Disposition: A | Discharge status: Home / Self Care | Payer: Self-pay | Attending: Family Medicine | Admitting: Family Medicine

## 2021-03-20 ENCOUNTER — Encounter: Payer: Self-pay | Admitting: Family Medicine

## 2021-03-20 VITALS — BP 160/100 | HR 80 | Ht 70.0 in | Wt 193.0 lb

## 2021-03-20 DIAGNOSIS — M25522 Pain in left elbow: Secondary | ICD-10-CM

## 2021-03-20 DIAGNOSIS — F419 Anxiety disorder, unspecified: Secondary | ICD-10-CM | POA: Diagnosis not present

## 2021-03-20 DIAGNOSIS — R430 Anosmia: Secondary | ICD-10-CM | POA: Diagnosis not present

## 2021-03-20 MED ORDER — ALPRAZOLAM 0.25 MG PO TABS: 0.2500 mg | ORAL_TABLET | Freq: Two times a day (BID) | ORAL | 1 refills | Status: DC | PRN

## 2021-03-20 MED ORDER — ESCITALOPRAM OXALATE 20 MG PO TABS: ORAL_TABLET | ORAL | 1 refills | Status: DC

## 2021-03-20 MED ORDER — TRAMADOL HCL 50 MG PO TABS: 50.0000 mg | ORAL_TABLET | Freq: Three times a day (TID) | ORAL | 0 refills | Status: AC | PRN

## 2021-03-20 MED ORDER — MELOXICAM 15 MG PO TABS: 15.0000 mg | ORAL_TABLET | Freq: Every day | ORAL | 0 refills | Status: DC

## 2021-03-20 NOTE — Progress Notes (Signed)
Date:  03/20/2021   Name:  Derek Blake   DOB:  1961/04/29   MRN:  [id number]   Chief Complaint: Elbow Pain and Depression  Depression        This is a chronic problem.  The current episode started more than 1 year ago.   The onset quality is undetermined.   The problem occurs rarely.  The problem has been waxing and waning since onset.  Associated symptoms include myalgias.  Associated symptoms include no decreased concentration, no fatigue, no helplessness, no hopelessness, does not have insomnia, not irritable, no restlessness, no decreased interest, no appetite change, no body aches, no headaches, no indigestion, not sad and no suicidal ideas.  Past treatments include SSRIs - Selective serotonin reuptake inhibitors.  Compliance with treatment is good. Arm Pain  The incident occurred more than 1 week ago. There was no injury mechanism. The pain is present in the left elbow. The quality of the pain is described as stabbing. The pain radiates to the left hand. The pain is at a severity of 8/10. The pain is severe. The pain has been Constant since the incident. Pertinent negatives include no numbness or tingling. Associated symptoms comments: History cervical surgery. The symptoms are aggravated by movement. He has tried NSAIDs for the symptoms. The treatment provided no relief.   Lab Results  Component Value Date   CREATININE 1.18 10/30/2019   BUN 10 10/30/2019   NA 136 10/30/2019   K 4.4 10/30/2019   CL 97 10/30/2019   CO2 25 10/30/2019   Lab Results  Component Value Date   CHOL 167 03/02/2016   HDL 32 (L) 03/02/2016   LDLCALC 65 03/02/2016   TRIG 351 (H) 03/02/2016   CHOLHDL 5.2 (H) 03/02/2016   Lab Results  Component Value Date   TSH 2.380 10/30/2019   Lab Results  Component Value Date   HGBA1C 5.5 10/30/2019   Lab Results  Component Value Date   WBC 7.3 10/30/2019   HGB 16.2 10/30/2019   HCT 46.9 10/30/2019   MCV 96 10/30/2019   PLT 279 10/30/2019   Lab  Results  Component Value Date   ALT 17 10/30/2019   AST 20 10/30/2019   ALKPHOS 89 10/30/2019   BILITOT 0.7 10/30/2019     Review of Systems  Constitutional:  Negative for appetite change and fatigue.  Musculoskeletal:  Positive for myalgias.  Neurological:  Negative for tingling, numbness and headaches.  Psychiatric/Behavioral:  Positive for depression. Negative for decreased concentration and suicidal ideas. The patient does not have insomnia.    Patient Active Problem List   Diagnosis Date Noted   Anxiety 02/14/2019    No Known Allergies  Past Surgical History:  Procedure Laterality Date   BACK SURGERY     COLONOSCOPY  2016   cleared for 10 yrs- Dr Bluford Kaufmann   NASAL SEPTUM SURGERY     TONSILLECTOMY      Social History   Tobacco Use   Smoking status: Former   Smokeless tobacco: Never  Substance Use Topics   Alcohol use: Yes    Alcohol/week: 0.0 standard drinks   Drug use: No     Medication list has been reviewed and updated.  Current Meds  Medication Sig   escitalopram (LEXAPRO) 20 MG tablet TAKE 1 TABLET BY MOUTH EVERY DAY *NEEDS APPT FOR REFILLS*   [DISCONTINUED] traZODone (DESYREL) 50 MG tablet Take 0.5-1 tablets (25-50 mg total) by mouth at bedtime as needed for sleep.  PHQ 2/9 Scores 03/20/2021 08/23/2020 11/14/2019 10/30/2019  PHQ - 2 Score 0 2 0 0  PHQ- 9 Score 0 5 0 1    GAD 7 : Generalized Anxiety Score 03/20/2021 08/23/2020 11/14/2019 10/30/2019  Nervous, Anxious, on Edge 0 0 0 2  Control/stop worrying 1 3 0 0  Worry too much - different things 1 3 0 0  Trouble relaxing 0 2 0 0  Restless 0 0 0 0  Easily annoyed or irritable 0 1 0 0  Afraid - awful might happen 0 3 0 0  Total GAD 7 Score 2 12 0 2  Anxiety Difficulty Not difficult at all Somewhat difficult - Not difficult at all    BP Readings from Last 3 Encounters:  08/23/20 130/90  11/14/19 120/70  10/30/19 130/80    Physical Exam Vitals and nursing note reviewed.  Constitutional:       General: He is not irritable. HENT:     Head: Normocephalic.     Right Ear: External ear normal.     Left Ear: External ear normal.     Nose: Nose normal.  Eyes:     General: No scleral icterus.       Right eye: No discharge.        Left eye: No discharge.     Conjunctiva/sclera: Conjunctivae normal.     Pupils: Pupils are equal, round, and reactive to light.  Neck:     Thyroid: No thyromegaly.     Vascular: No JVD.     Trachea: No tracheal deviation.  Cardiovascular:     Rate and Rhythm: Normal rate and regular rhythm.     Heart sounds: Normal heart sounds. No murmur heard.   No friction rub. No gallop.  Pulmonary:     Effort: No respiratory distress.     Breath sounds: Normal breath sounds. No wheezing or rales.  Abdominal:     General: Bowel sounds are normal.     Palpations: Abdomen is soft. There is no mass.     Tenderness: There is no abdominal tenderness. There is no guarding or rebound.  Musculoskeletal:     Left elbow: Swelling present. Decreased range of motion. Tenderness present in lateral epicondyle.     Cervical back: Normal range of motion and neck supple.  Lymphadenopathy:     Cervical: No cervical adenopathy.  Skin:    General: Skin is warm.     Findings: No rash.  Neurological:     Mental Status: He is alert and oriented to person, place, and time.     Cranial Nerves: Cranial nerves 2-12 are intact. No cranial nerve deficit.     Deep Tendon Reflexes: Reflexes are normal and symmetric.    Wt Readings from Last 3 Encounters:  03/20/21 193 lb (87.5 kg)  08/23/20 191 lb (86.6 kg)  11/14/19 194 lb (88 kg)    Ht 5\' 10"  (1.778 m)   Wt 193 lb (87.5 kg)   BMI 27.69 kg/m   Assessment and Plan:  1. Elbow pain, left New onset.  Uncontrolled.  Pain moderate to severe with use and palpation.  No history of injury patient has sudden development of left lateral epicondyle pain.  We will treat with meloxicam 15 mg and patient was also given tramadol to take at  night we will obtain an x-ray and referral has been made to sports medicine. - DG Elbow Complete Left; Future - meloxicam (MOBIC) 15 MG tablet; Take 1 tablet (15 mg total) by  mouth daily.  Dispense: 30 tablet; Refill: 0 - traMADol (ULTRAM) 50 MG tablet; Take 1 tablet (50 mg total) by mouth every 8 (eight) hours as needed for up to 5 days.  Dispense: 15 tablet; Refill: 0  2. Anxiety Chronic.  Controlled.  Stable.  Controlled on Lexapro 20 mg once a day.  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 - escitalopram (LEXAPRO) 20 MG tablet; TAKE 1 TABLET BY MOUTH EVERY DAY *NEEDS APPT FOR REFILLS*  Dispense: 90 tablet; Refill: 1 - ALPRAZolam (XANAX) 0.25 MG tablet; Take 1 tablet (0.25 mg total) by mouth 2 (two) times daily as needed for anxiety.  Dispense: 30 tablet; Refill: 1  3. Loss of perception for smell New onset.  68-month period of sudden smell and taste unassociated with COVID.  All COVID tests have been negative.  Given the duration and the absolute loss of smell and taste referral has been made to ear nose and throat for further evaluation. - Ambulatory referral to ENT

## 2021-03-24 ENCOUNTER — Encounter: Payer: No Typology Code available for payment source | Admitting: Family Medicine

## 2021-04-16 ENCOUNTER — Other Ambulatory Visit: Payer: Self-pay | Admitting: Family Medicine

## 2021-04-16 DIAGNOSIS — M25522 Pain in left elbow: Secondary | ICD-10-CM

## 2021-04-16 NOTE — Telephone Encounter (Signed)
Requested medications are due for refill today.  yes  Requested medications are on the active medications list.  yes  Last refill. 03/20/2021  Future visit scheduled.   yes  Notes to clinic.  Labs are expired.

## 2021-05-07 ENCOUNTER — Encounter: Payer: No Typology Code available for payment source | Admitting: Family Medicine

## 2021-05-08 ENCOUNTER — Encounter: Payer: Self-pay | Admitting: Family Medicine

## 2021-05-08 ENCOUNTER — Ambulatory Visit: Payer: No Typology Code available for payment source | Admitting: Family Medicine

## 2021-05-08 ENCOUNTER — Inpatient Hospital Stay (INDEPENDENT_AMBULATORY_CARE_PROVIDER_SITE_OTHER): Payer: No Typology Code available for payment source | Admitting: Radiology

## 2021-05-08 ENCOUNTER — Other Ambulatory Visit: Payer: Self-pay

## 2021-05-08 VITALS — BP 140/100 | HR 83 | Ht 70.0 in | Wt 197.0 lb

## 2021-05-08 DIAGNOSIS — M7712 Lateral epicondylitis, left elbow: Secondary | ICD-10-CM | POA: Diagnosis not present

## 2021-05-08 DIAGNOSIS — M7701 Medial epicondylitis, right elbow: Secondary | ICD-10-CM

## 2021-05-08 MED ORDER — TRIAMCINOLONE ACETONIDE 40 MG/ML IJ SUSP
80.0000 mg | Freq: Once | INTRAMUSCULAR | Status: AC
  Administered 2021-05-08: 80 mg

## 2021-05-08 NOTE — Patient Instructions (Signed)
You have just been given a cortisone injection to reduce pain and inflammation. After the injection you may notice immediate relief of pain as a result of the Lidocaine. It is important to rest the area of the injection for 24 to 48 hours after the injection. There is a possibility of some temporary increased discomfort and swelling for up to 72 hours until the cortisone begins to work. If you do have pain, simply rest the joint and use ice. If you can tolerate over the counter medications, you can try Tylenol, Aleve, or Advil for added relief per package instructions. -Relative rest x2 days and rest return to full activity (avoid strenuous activity between now and then) - Continue meloxicam once daily with food symptoms respond to cortisone - Start home exercise of the information provided and continue x4 weeks - Contact us for any lingering symptoms at or beyond the 4-week mark - Follow-up as needed

## 2021-05-08 NOTE — Progress Notes (Signed)
Primary Care / Sports Medicine Office Visit  Patient Information:  Patient ID: Derek Blake, male DOB: 04/29/1961 Age: 60 y.o. MRN: [id number]   Derek Blake is a pleasant 60 y.o. male presenting with the following:  Chief Complaint  Patient presents with   Elbow Pain    Left; since mid-October; X-Ray 03/20/21; no known injury or trauma per patient, although has frequent falls; taking meloxicam as needed with no relief; right-handedness; 8/10 pain    Patient Active Problem List   Diagnosis Date Noted   Left lateral epicondylitis 05/08/2021   Medial epicondylitis of elbow, right 05/08/2021   Anxiety 02/14/2019   Overweight 07/18/2018   Cervical radiculopathy 10/28/2017   Postoperative anemia due to acute blood loss 03/06/2016   Anxiety and depression 07/12/2015   Bilateral sciatica 07/12/2015   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  No results found.   Independent interpretation of notes and tests performed by another provider:   Independent interpretation of left elbow x-rays dated10/27/2022 reveal subtle cortical roughening/irregularity at the medial and lateral epicondyles, joint spaces preserved, no acute osseous processes identified.  Procedures performed:   Procedure:  Injection of left lateral epicondyle under ultrasound guidance. Ultrasound guidance utilized for visualization of the common extensor tendon at the epicondyle, needle placement Samsung HS60 device utilized with permanent recording / reporting. Consent obtained and verified. Skin prepped in a sterile fashion. Ethyl chloride spray for topical local  analgesia.  Completed without difficulty and tolerated well. Medication: triamcinolone acetonide 40 mg/mL suspension for injection 1 mL total and 2 mL lidocaine 1% without epinephrine utilized for needle placement anesthetic Advised to contact for fevers/chills, erythema, induration, drainage, or persistent bleeding.  Procedure:  Injection of right medial epicondyle under ultrasound guidance. Ultrasound guidance utilized for needle placement, visualization of the common flexor tendon Samsung HS60 device utilized with permanent recording / reporting. Consent obtained and verified. Skin prepped in a sterile fashion. Ethyl chloride spray for topical local analgesia.  Completed without difficulty and tolerated well. Medication: triamcinolone acetonide 40 mg/mL suspension for injection 1 mL total and 2 mL lidocaine 1% without epinephrine utilized for needle placement anesthetic Advised to contact for fevers/chills, erythema, induration, drainage, or persistent bleeding.   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  Pain of the left elbow is lateral without radiation, pain of the right elbow is medial without radiation, this aggravated by actives of daily living, lifting, pushing, pulling, turning hand.  Denies any overt radiation of symptoms proximally distally.  No trauma, no skin discoloration, swelling.  Treatments to date have included meloxicam with limited response.  Physical examination reveals limited flexion and extension bilaterally secondary to pain, left elbow consistent with provocative testing positive for lateral epicondylitis, right of testing at the right elbow positive for medial epicondylitis.  Sensorimotor otherwise intact, forearm tightness noted  bilaterally.  Given treatments to date, severity of symptoms, additional strategies outlined  and he is amenable to corticosteroid injections under ultrasound guidance to the left lateral epicondyle, right medial epicondyle.  I have advised patient on postinjection activity restriction x48 hours, to continue meloxicam until symptoms show full response, and importance of home exercise program to work towards full symptom resolution.  Home exercises were provided in that regard today.  He can follow-up on as-needed basis.  Medial epicondylitis of elbow, right See additional assessment(s) for plan details.   Orders & Medications No orders of the defined types were placed in this encounter.  Orders Placed This Encounter  Procedures   [place] LIMITED JOINT SPACE STRUCTURES UP BILAT     Return if symptoms worsen or fail to improve.     Jerrol Banana, MD   Primary Care Sports Medicine [facility]

## 2021-05-08 NOTE — Assessment & Plan Note (Signed)
See additional assessment(s) for plan details. 

## 2021-05-08 NOTE — Assessment & Plan Note (Signed)
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  Pain of the left elbow is lateral without radiation, pain of the right elbow is medial without radiation, this aggravated by actives of daily living, lifting, pushing, pulling, turning hand.  Denies any overt radiation of symptoms proximally distally.  No trauma, no skin discoloration, swelling.  Treatments to date have included meloxicam with limited response.  Physical examination reveals limited flexion and extension bilaterally secondary to pain, left elbow consistent with provocative testing positive for lateral epicondylitis, right of testing at the right elbow positive for medial epicondylitis.  Sensorimotor otherwise intact, forearm tightness noted bilaterally.  Given treatments to date, severity of symptoms, additional strategies outlined and he is amenable to corticosteroid injections under ultrasound guidance to the left lateral epicondyle, right medial epicondyle.  I have advised patient on postinjection activity restriction x48 hours, to continue meloxicam until symptoms show full response, and importance of home exercise program to work towards full symptom resolution.  Home exercises were provided in that regard today.  He can follow-up on as-needed basis.

## 2021-05-08 NOTE — Addendum Note (Signed)
Addended by: Lennart Pall on: 05/08/2021 04:55 PM   Modules accepted: Orders

## 2021-05-13 ENCOUNTER — Other Ambulatory Visit: Payer: Self-pay | Admitting: Pediatrics

## 2021-05-13 DIAGNOSIS — M5416 Radiculopathy, lumbar region: Secondary | ICD-10-CM

## 2021-06-18 ENCOUNTER — Telehealth: Payer: Self-pay

## 2021-06-18 ENCOUNTER — Ambulatory Visit: Payer: Self-pay | Admitting: Family Medicine

## 2021-06-18 NOTE — Telephone Encounter (Signed)
Mailbox is full could not leave voicemail to reschedule app.

## 2021-06-30 ENCOUNTER — Ambulatory Visit
Admission: RE | Admit: 2021-06-30 | Discharge: 2021-06-30 | Disposition: A | Discharge status: Home / Self Care | Payer: Disability Insurance | Source: Ambulatory Visit | Attending: Pediatrics | Admitting: Pediatrics

## 2021-06-30 DIAGNOSIS — M5416 Radiculopathy, lumbar region: Secondary | ICD-10-CM | POA: Insufficient documentation

## 2021-07-13 ENCOUNTER — Other Ambulatory Visit: Payer: Self-pay | Admitting: Family Medicine

## 2021-07-13 DIAGNOSIS — F419 Anxiety disorder, unspecified: Secondary | ICD-10-CM

## 2021-07-14 NOTE — Telephone Encounter (Signed)
Requested medication (s) are due for refill today: Yes  Requested medication (s) are on the active medication list:Yes  Last refill:  03/20/21  Future visit scheduled: No  Notes to clinic:  Unable to refill per protocol, cannot delegate.      Requested Prescriptions  Pending Prescriptions Disp Refills   ALPRAZolam (XANAX) 0.25 MG tablet [Pharmacy Med Name: ALPRAZOLAM 0.25 MG TABLET] 30 tablet 1    Sig: TAKE 1 TABLET BY MOUTH 2 TIMES DAILY AS NEEDED FOR ANXIETY.     Not Delegated - Psychiatry: Anxiolytics/Hypnotics 2 Failed - 07/13/2021  1:13 PM      Failed - This refill cannot be delegated      Failed - Urine Drug Screen completed in last 360 days      Passed - Patient is not pregnant      Passed - Valid encounter within last 6 months    Recent Outpatient Visits           2 months ago Left lateral epicondylitis   [facility] Jerrol Banana, MD   3 months ago Elbow pain, left   [facility] Duanne Limerick, MD   10 months ago Reactive depression   [facility] Duanne Limerick, MD   1 year ago Sebaceous cyst of right axilla   [facility] Duanne Limerick, MD   1 year ago [facility] and fatigue   [facility] Duanne Limerick, MD

## 2021-08-18 ENCOUNTER — Telehealth: Payer: Self-pay | Admitting: Family Medicine

## 2021-08-18 DIAGNOSIS — F419 Anxiety disorder, unspecified: Secondary | ICD-10-CM

## 2021-08-18 NOTE — Telephone Encounter (Signed)
Medication Refill - Medication:ALPRAZolam (XANAX) 0.25 MG tablet pt asking for 90 day supply ? ?Has the patient contacted their pharmacy? yes ?(Agent: If no, request that the patient contact the pharmacy for the refill. If patient does not wish to contact the pharmacy document the reason why and proceed with request.) ?(Agent: If yes, when and what did the pharmacy advise?)contact pcp ? ?Preferred Pharmacy (with phone number or street name): ?[id number] - [address], [place] Phone:  [phone]  ?Fax:  [phone]  ?  ? ?Has the patient been seen for an appointment in the last year OR does the patient have an upcoming appointment? yes ? ?Agent: Please be advised that RX refills may take up to 3 business days. We ask that you follow-up with your pharmacy.  ?

## 2021-08-18 NOTE — Telephone Encounter (Signed)
Requested medication (s) are due for refill today: yes ? ?Requested medication (s) are on the active medication list: yes ? ?Last refill:  03/20/21 #30 with 1 refill ? ?Future visit scheduled: no ? ?Notes to clinic:  Please review for refill. Refill not delegated per protocl. Patient requesting 90 day supply,  ? ? ? ?Requested Prescriptions  ?Pending Prescriptions Disp Refills  ? ALPRAZolam (XANAX) 0.25 MG tablet 30 tablet 1  ?  Sig: Take 1 tablet (0.25 mg total) by mouth 2 (two) times daily as needed for anxiety.  ?  ? Not Delegated - Psychiatry: Anxiolytics/Hypnotics 2 Failed - 08/18/2021 11:25 PM  ?  ?  Failed - This refill cannot be delegated  ?  ?  Failed - Urine Drug Screen completed in last 360 days  ?  ?  Passed - Patient is not pregnant  ?  ?  Passed - Valid encounter within last 6 months  ?  Recent Outpatient Visits   ? ?      ? 3 months ago Left lateral epicondylitis  ? [facility] Jerrol Banana, MD  ? 5 months ago Elbow pain, left  ? [facility] Duanne Limerick, MD  ? 12 months ago Reactive depression  ? [facility] Duanne Limerick, MD  ? 1 year ago Sebaceous cyst of right axilla  ? [facility] Duanne Limerick, MD  ? 1 year ago Malaise and fatigue  ? [facility] Duanne Limerick, MD  ? ?  ?  ? ?  ?  ?  ? ?

## 2021-08-19 NOTE — Telephone Encounter (Signed)
Called patient and lvm to set up follow up for depression on medication refills ?

## 2021-08-19 NOTE — Telephone Encounter (Signed)
Patient requesting a 90 day supply of ALPRAZolam (XANAX) 0.25 MG tablet due to insurance not being able to cover a 30 day supply. Patient states he has been without medication for a a few days and would like PCP to send it today. Patient scheduled a follow up appointment with PCP for 08/20/2021  ?

## 2021-08-20 ENCOUNTER — Ambulatory Visit: Payer: No Typology Code available for payment source | Admitting: Family Medicine

## 2021-08-25 NOTE — Telephone Encounter (Signed)
Patient called in and insist on speaking with Derek Blake about refill on escitalopram (LEXAPRO) 20 MG tablet asking for a call back patient informed that since last visit with Dr Ronnald Ramp was in October of 2022 he need to be seen please advise call on Ph#  [phone] ?

## 2021-08-26 ENCOUNTER — Ambulatory Visit (INDEPENDENT_AMBULATORY_CARE_PROVIDER_SITE_OTHER): Payer: No Typology Code available for payment source | Admitting: Family Medicine

## 2021-08-26 ENCOUNTER — Encounter: Payer: Self-pay | Admitting: Family Medicine

## 2021-08-26 VITALS — BP 144/93 | HR 80 | Ht 70.0 in | Wt 195.0 lb

## 2021-08-26 DIAGNOSIS — R030 Elevated blood-pressure reading, without diagnosis of hypertension: Secondary | ICD-10-CM

## 2021-08-26 DIAGNOSIS — F329 Major depressive disorder, single episode, unspecified: Secondary | ICD-10-CM

## 2021-08-26 DIAGNOSIS — F419 Anxiety disorder, unspecified: Secondary | ICD-10-CM | POA: Diagnosis not present

## 2021-08-26 DIAGNOSIS — R10811 Right upper quadrant abdominal tenderness: Secondary | ICD-10-CM

## 2021-08-26 DIAGNOSIS — E785 Hyperlipidemia, unspecified: Secondary | ICD-10-CM | POA: Diagnosis not present

## 2021-08-26 MED ORDER — AMLODIPINE BESYLATE 2.5 MG PO TABS: 2.5000 mg | ORAL_TABLET | Freq: Every day | ORAL | 1 refills | Status: DC

## 2021-08-26 MED ORDER — ESCITALOPRAM OXALATE 20 MG PO TABS: ORAL_TABLET | ORAL | 1 refills | Status: DC

## 2021-08-26 MED ORDER — ALPRAZOLAM 0.25 MG PO TABS: 0.2500 mg | ORAL_TABLET | Freq: Two times a day (BID) | ORAL | 1 refills | Status: DC | PRN

## 2021-08-26 NOTE — Progress Notes (Signed)
? ? ?Date:  08/26/2021  ? ?Name:  Derek Blake   DOB:  [birth year]   MRN:  [id number] ? ? ?Chief Complaint: Anxiety ? ?Anxiety ?Symptoms include nausea. Patient reports no chest pain, dizziness, nervous/anxious behavior, palpitations, shortness of breath or suicidal ideas.  ? ? ? ?Lab Results  ?Component Value Date  ? NA 136 10/30/2019  ? K 4.4 10/30/2019  ? CO2 25 10/30/2019  ? GLUCOSE 100 (H) 10/30/2019  ? BUN 10 10/30/2019  ? CREATININE 1.18 10/30/2019  ? CALCIUM 9.5 10/30/2019  ? GFRNONAA 68 10/30/2019  ? ?Lab Results  ?Component Value Date  ? CHOL 167 03/02/2016  ? HDL 32 (L) 03/02/2016  ? LDLCALC 65 03/02/2016  ? TRIG 351 (H) 03/02/2016  ? CHOLHDL 5.2 (H) 03/02/2016  ? ?Lab Results  ?Component Value Date  ? TSH 2.380 10/30/2019  ? ?Lab Results  ?Component Value Date  ? HGBA1C 5.5 10/30/2019  ? ?Lab Results  ?Component Value Date  ? WBC 7.3 10/30/2019  ? HGB 16.2 10/30/2019  ? HCT 46.9 10/30/2019  ? MCV 96 10/30/2019  ? PLT 279 10/30/2019  ? ?Lab Results  ?Component Value Date  ? ALT 17 10/30/2019  ? AST 20 10/30/2019  ? ALKPHOS 89 10/30/2019  ? BILITOT 0.7 10/30/2019  ? ?No results found for: 25OHVITD2, 25OHVITD3, VD25OH  ? ?Review of Systems  ?Constitutional:  Negative for chills and fever.  ?HENT:  Negative for drooling, ear discharge, ear pain and sore throat.   ?Respiratory:  Negative for cough, shortness of breath and wheezing.   ?Cardiovascular:  Negative for chest pain, palpitations and leg swelling.  ?Gastrointestinal:  Positive for abdominal pain and nausea. Negative for blood in stool, constipation and diarrhea.  ?Endocrine: Negative for polydipsia.  ?Genitourinary:  Negative for dysuria, frequency, hematuria and urgency.  ?Musculoskeletal:  Negative for back pain, myalgias and neck pain.  ?Skin:  Negative for rash.  ?Allergic/Immunologic: Negative for environmental allergies.  ?Neurological:  Negative for dizziness and headaches.  ?Hematological:  Does not bruise/bleed easily.   ?Psychiatric/Behavioral:  Negative for suicidal ideas. The patient is not nervous/anxious.   ? ?Patient Active Problem List  ? Diagnosis Date Noted  ? Left lateral epicondylitis 05/08/2021  ? Medial epicondylitis of elbow, right 05/08/2021  ? Anxiety 02/14/2019  ? Overweight 07/18/2018  ? Cervical radiculopathy 10/28/2017  ? Postoperative anemia due to acute blood loss 03/06/2016  ? Anxiety and depression 07/12/2015  ? Bilateral sciatica 07/12/2015  ? 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 Status post lumbar spine surgery for decompression of spinal cord 07/12/2015  ? Lumbar nerve root compression 07/04/2015  ? ? ?No Known Allergies ? ?Past Surgical History:  ?Procedure Laterality Date  ? BACK SURGERY    ? COLONOSCOPY  2016  ? cleared for 10 yrs- Dr Bluford Kaufmann  ? NASAL SEPTUM SURGERY    ? TONSILLECTOMY    ? ? ?Social History  ? ?Tobacco Use  ? Smoking status: Former  ? Smokeless tobacco: Never  ?Substance Use Topics  ? Alcohol use: Yes  ?  Alcohol/week: 0.0 standard drinks  ? Drug use: No  ? ? ? ?Medication list has been reviewed and updated. ? ?Current Meds  ?Medication Sig  ? ALPRAZolam (XANAX) 0.25 MG tablet Take 1 tablet (0.25 mg total) by mouth 2 (two) times daily as needed for anxiety.  ? escitalopram (LEXAPRO) 20 MG tablet TAKE 1 TABLET BY MOUTH EVERY DAY *NEEDS APPT FOR REFILLS*  ? ? ? ?  08/26/2021  ? 10:46 AM 05/08/2021  ?  4:00 PM 03/20/2021  ? 11:46 AM 08/23/2020  ?  2:20 PM  ?GAD 7 : Generalized Anxiety Score  ?Nervous, Anxious, on Edge 0 0 0 0  ?Control/stop worrying 0 0 1 3  ?Worry too much - different things 0 0 1 3  ?Trouble relaxing 0 0 0 2  ?Restless 0 0 0 0  ?Easily annoyed or irritable 0 0 0 1  ?Afraid - awful might happen 0 0 0 3  ?Total GAD 7 Score 0 0 2 12  ?Anxiety Difficulty Not difficult at all Not difficult at all Not difficult at all Somewhat difficult  ? ? ? ?  08/26/2021  ? 10:46 AM  ?Depression screen PHQ 2/9  ?Decreased Interest 0  ?Down, Depressed, Hopeless 2   ?PHQ - 2 Score 2  ?Altered sleeping 2  ?Tired, decreased energy 2  ?Change in appetite 0  ?Feeling bad or failure about yourself  0  ?Trouble concentrating 0  ?Moving slowly or fidgety/restless 0  ?Suicidal thoughts 0  ?PHQ-9 Score 6  ?Difficult doing work/chores Not difficult at all  ? ? ?BP Readings from Last 3 Encounters:  ?08/26/21 (!) 144/93  ?05/08/21 (!) 140/100  ?03/20/21 (!) 160/100  ? ? ?Physical Exam ?Vitals and nursing note reviewed.  ?HENT:  ?   Head: Normocephalic.  ?   Right Ear: Tympanic membrane and external ear normal.  ?   Left Ear: Tympanic membrane and external ear normal.  ?   Nose: Nose normal. No rhinorrhea.  ?Eyes:  ?   General: No scleral icterus.    ?   Right eye: No discharge.     ?   Left eye: No discharge.  ?   Conjunctiva/sclera: Conjunctivae normal.  ?   Pupils: Pupils are equal, round, and reactive to light.  ?Neck:  ?   Thyroid: No thyromegaly.  ?   Vascular: No JVD.  ?   Trachea: No tracheal deviation.  ?Cardiovascular:  ?   Rate and Rhythm: Normal rate and regular rhythm.  ?   Heart sounds: Normal heart sounds. No murmur heard. ?  No friction rub. No gallop.  ?Pulmonary:  ?   Effort: No respiratory distress.  ?   Breath sounds: Normal breath sounds. No wheezing, rhonchi or rales.  ?Abdominal:  ?   General: Bowel sounds are normal.  ?   Palpations: Abdomen is soft. There is no hepatomegaly, splenomegaly or mass.  ?   Tenderness: There is abdominal tenderness in the right upper quadrant. There is no guarding or rebound.  ?Musculoskeletal:     ?   General: No tenderness. Normal range of motion.  ?   Cervical back: Normal range of motion and neck supple.  ?Lymphadenopathy:  ?   Cervical: No cervical adenopathy.  ?Skin: ?   General: Skin is warm.  ?   Findings: No rash.  ?Neurological:  ?   Mental Status: He is alert and oriented to person, place, and time.  ?   Cranial Nerves: No cranial nerve deficit.  ?   Deep Tendon Reflexes: Reflexes are normal and symmetric.  ? ? ?Wt Readings  from Last 3 Encounters:  ?08/26/21 195 lb (88.5 kg)  ?05/08/21 197 lb (89.4 kg)  ?03/20/21 193 lb (87.5 kg)  ? ? ?BP (!) 144/93 (BP Location: Left Arm, Cuff Size: Large)   Pulse 80   Ht 5\' 10"  (1.778 m)   Wt 195 lb (88.5 kg)  BMI 27.98 kg/m?  ? ?Assessment and Plan: ? ?1. Elevated blood pressure, situational ?New onset.  Persistent.  Blood pressure 144/93.  We will initiate amlodipine 2.5 mg once a day.  We will recheck patient in 4 weeks.  We will obtain renal function panel for current electrolytes and GFR. ?- amLODipine (NORVASC) 2.5 MG tablet; Take 1 tablet (2.5 mg total) by mouth daily.  Dispense: 90 tablet; Refill: 1 ?- Renal function panel ? ?2. Anxiety ?Chronic.  Persistent.  Stable.  Gad score 0 patient is under undue stress due to spouse's illness.  We will treat with escitalopram 20 mg once a day and occasional alprazolam 0.25 as needed for anxiety not to exceed 30 per 30-day.. ?- ALPRAZolam (XANAX) 0.25 MG tablet; Take 1 tablet (0.25 mg total) by mouth 2 (two) times daily as needed for anxiety.  Dispense: 30 tablet; Refill: 1 ?- escitalopram (LEXAPRO) 20 MG tablet; TAKE 1 TABLET BY MOUTH EVERY DAY  Dispense: 90 tablet; Refill: 1 ? ?3. Dyslipidemia ?Chronic.  Controlled.  Stable.  Continue dietary approach to lipid control.  Will check lipid panel. ?- Lipid Panel With LDL/HDL Ratio ? 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  Controlled.  Stable.  PHQ is 6.  We will continue S-Citalopram 20 mg once a day. ?- escitalopram (LEXAPRO) 20 MG tablet; TAKE 1 TABLET BY MOUTH EVERY DAY  Dispense: 90 tablet; Refill: 1 ? ?5. Right upper quadrant abdominal tenderness without rebound tenderness ?On examination patient has exquisite tenderness in the right upper quadrant with deep inspiration suggesting a hepatic tenderness.  This may be either gallbladder as well.  Patient is not having any other symptoms other than nausea.  Patient does not admit to significant alcohol intake but has had some.  We will check a hepatic  panel and pending those results we will determine if we take further look with imaging. ?- Hepatic Function Panel (6)  ? ?

## 2021-08-27 LAB — HEPATIC FUNCTION PANEL (6)
ALT: 15 IU/L (ref 0–44)
AST: 17 IU/L (ref 0–40)
Albumin: 4.4 g/dL (ref 3.8–4.9)
Alkaline Phosphatase: 91 IU/L (ref 44–121)
Bilirubin Total: 0.2 mg/dL (ref 0.0–1.2)
Bilirubin, Direct: <0.1 mg/dL (ref 0.00–0.40)

## 2021-08-27 LAB — LIPID PANEL WITH LDL/HDL RATIO
Cholesterol, Total: 204 mg/dL — ABNORMAL HIGH (ref 100–199)
HDL: 54 mg/dL (ref >39)
LDL Chol Calc (NIH): 138 mg/dL — ABNORMAL HIGH (ref 0–99)
LDL/HDL Ratio: 2.6 ratio (ref 0.0–3.6)
Triglycerides: 67 mg/dL (ref 0–149)
VLDL Cholesterol Cal: 12 mg/dL (ref 5–40)

## 2021-08-27 LAB — RENAL FUNCTION PANEL
Albumin: 4.3 g/dL (ref 3.8–4.9)
BUN/Creatinine Ratio: 10 (ref 9–20)
BUN: 9 mg/dL (ref 6–24)
CO2: 24 mmol/L (ref 20–29)
Calcium: 8.8 mg/dL (ref 8.7–10.2)
Chloride: 100 mmol/L (ref 96–106)
Creatinine, Ser: 0.92 mg/dL (ref 0.76–1.27)
Glucose: 101 mg/dL — ABNORMAL HIGH (ref 70–99)
Phosphorus: 3.2 mg/dL (ref 2.8–4.1)
Potassium: 4.3 mmol/L (ref 3.5–5.2)
Sodium: 139 mmol/L (ref 134–144)
eGFR: 96 mL/min/{1.73_m2} (ref >59)

## 2021-09-23 ENCOUNTER — Ambulatory Visit: Payer: No Typology Code available for payment source | Admitting: Family Medicine

## 2021-09-23 ENCOUNTER — Encounter: Payer: Self-pay | Admitting: Family Medicine

## 2021-09-23 VITALS — BP 146/92 | HR 68 | Ht 70.0 in | Wt 192.0 lb

## 2021-09-23 DIAGNOSIS — R030 Elevated blood-pressure reading, without diagnosis of hypertension: Secondary | ICD-10-CM | POA: Diagnosis not present

## 2021-09-23 DIAGNOSIS — F329 Major depressive disorder, single episode, unspecified: Secondary | ICD-10-CM

## 2021-09-23 DIAGNOSIS — F419 Anxiety disorder, unspecified: Secondary | ICD-10-CM | POA: Diagnosis not present

## 2021-09-23 DIAGNOSIS — M5412 Radiculopathy, cervical region: Secondary | ICD-10-CM | POA: Diagnosis not present

## 2021-09-23 DIAGNOSIS — F32A Depression, unspecified: Secondary | ICD-10-CM

## 2021-09-23 MED ORDER — ALPRAZOLAM 0.25 MG PO TABS: 0.2500 mg | ORAL_TABLET | Freq: Two times a day (BID) | ORAL | 1 refills | Status: DC | PRN

## 2021-09-23 MED ORDER — ESCITALOPRAM OXALATE 20 MG PO TABS: ORAL_TABLET | ORAL | 1 refills | Status: DC

## 2021-09-23 MED ORDER — AMLODIPINE BESYLATE 5 MG PO TABS: 5.0000 mg | ORAL_TABLET | Freq: Every day | ORAL | 0 refills | Status: DC

## 2021-09-23 MED ORDER — MELOXICAM 15 MG PO TABS: 15.0000 mg | ORAL_TABLET | Freq: Every day | ORAL | 0 refills | Status: DC

## 2021-09-23 NOTE — Patient Instructions (Signed)
Marland KitchenmmcDASH Eating Plan ?DASH stands for Dietary Approaches to Stop Hypertension. The DASH eating plan is a healthy eating plan that has been shown to: ?Reduce high blood pressure (hypertension). ?Reduce your risk for type 2 diabetes, heart disease, and stroke. ?Help with weight loss. ?What are tips for following this plan? ?Reading food labels ?Check food labels for the amount of salt (sodium) per serving. Choose foods with less than 5 percent of the Daily Value of sodium. Generally, foods with less than 300 milligrams (mg) of sodium per serving fit into this eating plan. ?To find whole grains, look for the word "whole" as the first word in the ingredient list. ?Shopping ?Buy products labeled as "low-sodium" or "no salt added." ?Buy fresh foods. Avoid canned foods and pre-made or frozen meals. ?Cooking ?Avoid adding salt when cooking. Use salt-free seasonings or herbs instead of table salt or sea salt. Check with your health care provider or pharmacist before using salt substitutes. ?Do not fry foods. Cook foods using healthy methods such as baking, boiling, grilling, roasting, and broiling instead. ?Cook with heart-healthy oils, such as olive, canola, avocado, soybean, or sunflower oil. ?Meal planning ? ?Eat a balanced diet that includes: ?4 or more servings of fruits and 4 or more servings of vegetables each day. Try to fill one-half of your plate with fruits and vegetables. ?6-8 servings of whole grains each day. ?Less than 6 oz (170 g) of lean meat, poultry, or fish each day. A 3-oz (85-g) serving of meat is about the same size as a deck of cards. One egg equals 1 oz (28 g). ?2-3 servings of low-fat dairy each day. One serving is 1 cup (237 mL). ?1 serving of nuts, seeds, or beans 5 times each week. ?2-3 servings of heart-healthy fats. Healthy fats called omega-3 fatty acids are found in foods such as walnuts, flaxseeds, fortified milks, and eggs. These fats are also found in cold-water fish, such as sardines,  salmon, and mackerel. ?Limit how much you eat of: ?Canned or prepackaged foods. ?Food that is high in trans fat, such as some fried foods. ?Food that is high in saturated fat, such as fatty meat. ?Desserts and other sweets, sugary drinks, and other foods with added sugar. ?Full-fat dairy products. ?Do not salt foods before eating. ?Do not eat more than 4 egg yolks a week. ?Try to eat at least 2 vegetarian meals a week. ?Eat more home-cooked food and less restaurant, buffet, and fast food. ?Lifestyle ?When eating at a restaurant, ask that your food be prepared with less salt or no salt, if possible. ?If you drink alcohol: ?Limit how much you use to: ?0-1 drink a day for women who are not pregnant. ?0-2 drinks a day for men. ?Be aware of how much alcohol is in your drink. In the [place] one drink equals one 12 oz bottle of beer (355 mL), one 5 oz glass of wine (148 mL), or one 1? oz glass of hard liquor (44 mL). ?General information ?Avoid eating more than 2,300 mg of salt a day. If you have hypertension, you may need to reduce your sodium intake to 1,500 mg a day. ?Work with your health care provider to maintain a healthy body weight or to lose weight. Ask what an ideal weight is for you. ?Get at least 30 minutes of exercise that causes your heart to beat faster (aerobic exercise) most days of the week. Activities may include walking, swimming, or biking. ?Work with your health care provider or dietitian to  adjust your eating plan to your individual calorie needs. ?What foods should I eat? ?Fruits ?All fresh, dried, or frozen fruit. Canned fruit in natural juice (without added sugar). ?Vegetables ?Fresh or frozen vegetables (raw, steamed, roasted, or grilled). Low-sodium or reduced-sodium tomato and vegetable juice. Low-sodium or reduced-sodium tomato sauce and tomato paste. Low-sodium or reduced-sodium canned vegetables. ?Grains ?Whole-grain or whole-wheat bread. Whole-grain or whole-wheat pasta. Brown rice. Oatmeal.  Quinoa. Bulgur. Whole-grain and low-sodium cereals. Pita bread. Low-fat, low-sodium crackers. Whole-wheat flour tortillas. ?Meats and other proteins ?Skinless chicken or turkey. Ground chicken or turkey. Pork with fat trimmed off. Fish and seafood. Egg whites. Dried beans, peas, or lentils. Unsalted nuts, nut butters, and seeds. Unsalted canned beans. Lean cuts of beef with fat trimmed off. Low-sodium, lean precooked or cured meat, such as sausages or meat loaves. ?Dairy ?Low-fat (1%) or fat-free (skim) milk. Reduced-fat, low-fat, or fat-free cheeses. Nonfat, low-sodium ricotta or cottage cheese. Low-fat or nonfat yogurt. Low-fat, low-sodium cheese. ?Fats and oils ?Soft margarine without trans fats. Vegetable oil. Reduced-fat, low-fat, or light mayonnaise and salad dressings (reduced-sodium). Canola, safflower, olive, avocado, soybean, and sunflower oils. Avocado. ?Seasonings and condiments ?Herbs. Spices. Seasoning mixes without salt. ?Other foods ?Unsalted popcorn and pretzels. Fat-free sweets. ?The items listed above may not be a complete list of foods and beverages you can eat. Contact a dietitian for more information. ?What foods should I avoid? ?Fruits ?Canned fruit in a light or heavy syrup. Fried fruit. Fruit in cream or butter sauce. ?Vegetables ?Creamed or fried vegetables. Vegetables in a cheese sauce. Regular canned vegetables (not low-sodium or reduced-sodium). Regular canned tomato sauce and paste (not low-sodium or reduced-sodium). Regular tomato and vegetable juice (not low-sodium or reduced-sodium). Pickles. Olives. ?Grains ?Baked goods made with fat, such as croissants, muffins, or some breads. Dry pasta or rice meal packs. ?Meats and other proteins ?Fatty cuts of meat. Ribs. Fried meat. Bacon. Bologna, salami, and other precooked or cured meats, such as sausages or meat loaves. Fat from the back of a pig (fatback). Bratwurst. Salted nuts and seeds. Canned beans with added salt. Canned or smoked  fish. Whole eggs or egg yolks. Chicken or turkey with skin. ?Dairy ?Whole or 2% milk, cream, and half-and-half. Whole or full-fat cream cheese. Whole-fat or sweetened yogurt. Full-fat cheese. Nondairy creamers. Whipped toppings. Processed cheese and cheese spreads. ?Fats and oils ?Butter. Stick margarine. Lard. Shortening. Ghee. Bacon fat. Tropical oils, such as coconut, palm kernel, or palm oil. ?Seasonings and condiments ?Onion salt, garlic salt, seasoned salt, table salt, and sea salt. Worcestershire sauce. Tartar sauce. Barbecue sauce. Teriyaki sauce. Soy sauce, including reduced-sodium. Steak sauce. Canned and packaged gravies. Fish sauce. Oyster sauce. Cocktail sauce. Store-bought horseradish. Ketchup. Mustard. Meat flavorings and tenderizers. Bouillon cubes. Hot sauces. Pre-made or packaged marinades. Pre-made or packaged taco seasonings. Relishes. Regular salad dressings. ?Other foods ?Salted popcorn and pretzels. ?The items listed above may not be a complete list of foods and beverages you should avoid. Contact a dietitian for more information. ?Where to find more information ?National Heart, Lung, and Blood Institute: www.nhlbi.nih.gov ?American Heart Association: www.heart.org ?Academy of Nutrition and Dietetics: www.eatright.org ?National Kidney Foundation: www.kidney.org ?Summary ?The DASH eating plan is a healthy eating plan that has been shown to reduce high blood pressure (hypertension). It may also reduce your risk for type 2 diabetes, heart disease, and stroke. ?When on the DASH eating plan, aim to eat more fresh fruits and vegetables, whole grains, lean proteins, low-fat dairy, and heart-healthy fats. ?With the DASH   eating plan, you should limit salt (sodium) intake to 2,300 mg a day. If you have hypertension, you may need to reduce your sodium intake to 1,500 mg a day. ?Work with your health care provider or dietitian to adjust your eating plan to your individual calorie needs. ?This information  is not intended to replace advice given to you by your health care provider. Make sure you discuss any questions you have with your health care provider. ?Document Revised: 04/14/2019 Document Reviewed: 11

## 2021-09-23 NOTE — Progress Notes (Signed)
? ? ?Date:  09/23/2021  ? ?Name:  Derek Blake   DOB:  1961-02-01   MRN:  [id number] ? ? ?Chief Complaint: Hypertension, Depression, and Neck Pain ? ?Hypertension ?This is a chronic problem. The current episode started more than 1 year ago. The problem has been gradually improving since onset. The problem is controlled. Associated symptoms include neck pain. Pertinent negatives include no anxiety, blurred vision, chest pain, headaches, malaise/fatigue, orthopnea, palpitations, peripheral edema, PND, shortness of breath or sweats. There are no associated agents to hypertension. Risk factors for coronary artery disease include dyslipidemia. Past treatments include calcium channel blockers. The current treatment provides moderate improvement. There are no compliance problems.  There is no history of angina, kidney disease, CAD/MI, CVA, heart failure, left ventricular hypertrophy, PVD or retinopathy. There is no history of chronic renal disease, a hypertension causing med or renovascular disease.  ?Depression ?       This is a chronic problem.  The problem occurs intermittently.  The problem has been waxing and waning since onset.  Associated symptoms include helplessness and hopelessness.  Associated symptoms include no decreased concentration, does not have insomnia, not irritable, no restlessness, no myalgias, no headaches, not sad and no suicidal ideas.  Past treatments include SSRIs - Selective serotonin reuptake inhibitors.  Compliance with treatment is good.  Previous treatment provided moderate relief.   Pertinent negatives include no anxiety. ?Neck Pain  ?This is a recurrent problem. The current episode started more than 1 month ago. The problem occurs daily. The problem has been gradually worsening. The pain is associated with nothing. The pain is at a severity of 5/10. The pain is moderate. Pertinent negatives include no chest pain, fever or headaches.  ? ?Lab Results  ?Component Value Date  ? NA 139  08/26/2021  ? K 4.3 08/26/2021  ? CO2 24 08/26/2021  ? GLUCOSE 101 (H) 08/26/2021  ? BUN 9 08/26/2021  ? CREATININE 0.92 08/26/2021  ? CALCIUM 8.8 08/26/2021  ? EGFR 96 08/26/2021  ? GFRNONAA 68 10/30/2019  ? ?Lab Results  ?Component Value Date  ? CHOL 204 (H) 08/26/2021  ? HDL 54 08/26/2021  ? LDLCALC 138 (H) 08/26/2021  ? TRIG 67 08/26/2021  ? CHOLHDL 5.2 (H) 03/02/2016  ? ?Lab Results  ?Component Value Date  ? TSH 2.380 10/30/2019  ? ?Lab Results  ?Component Value Date  ? HGBA1C 5.5 10/30/2019  ? ?Lab Results  ?Component Value Date  ? WBC 7.3 10/30/2019  ? HGB 16.2 10/30/2019  ? HCT 46.9 10/30/2019  ? MCV 96 10/30/2019  ? PLT 279 10/30/2019  ? ?Lab Results  ?Component Value Date  ? ALT 15 08/26/2021  ? AST 17 08/26/2021  ? ALKPHOS 91 08/26/2021  ? BILITOT 0.2 08/26/2021  ? ?No results found for: 25OHVITD2, [place], VD25OH  ? ?Review of Systems  ?Constitutional:  Negative for chills, fever and malaise/fatigue.  ?HENT:  Negative for drooling, ear discharge, ear pain and sore throat.   ?Eyes:  Negative for blurred vision.  ?Respiratory:  Negative for cough, shortness of breath and wheezing.   ?Cardiovascular:  Negative for chest pain, palpitations, orthopnea, leg swelling and PND.  ?Gastrointestinal:  Negative for abdominal pain, blood in stool, constipation, diarrhea and nausea.  ?Endocrine: Negative for polydipsia.  ?Genitourinary:  Negative for dysuria, frequency, hematuria and urgency.  ?Musculoskeletal:  Positive for neck pain. Negative for back pain and myalgias.  ?Skin:  Negative for rash.  ?Allergic/Immunologic: Negative for environmental allergies.  ?Neurological:  Negative  for dizziness and headaches.  ?Hematological:  Does not bruise/bleed easily.  ?Psychiatric/Behavioral:  Positive for depression. Negative for decreased concentration and suicidal ideas. The patient is not nervous/anxious and does not have insomnia.   ? ?Patient Active Problem List  ? Diagnosis Date Noted  ? Left lateral epicondylitis  05/08/2021  ? Medial epicondylitis of elbow, right 05/08/2021  ? Anxiety 02/14/2019  ? Overweight 07/18/2018  ? Cervical radiculopathy 10/28/2017  ? Postoperative anemia due to acute blood loss 03/06/2016  ? Anxiety and depression 07/12/2015  ? Bilateral sciatica 07/12/2015  ? 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 Status post lumbar spine surgery for decompression of spinal cord 07/12/2015  ? Lumbar nerve root compression 07/04/2015  ? ? ?No Known Allergies ? ?Past Surgical History:  ?Procedure Laterality Date  ? BACK SURGERY    ? COLONOSCOPY  2016  ? cleared for 10 yrs- Dr Candace Cruise  ? NASAL SEPTUM SURGERY    ? TONSILLECTOMY    ? ? ?Social History  ? ?Tobacco Use  ? Smoking status: Former  ? Smokeless tobacco: Never  ?Substance Use Topics  ? Alcohol use: Yes  ?  Alcohol/week: 0.0 standard drinks  ? Drug use: No  ? ? ? ?Medication list has been reviewed and updated. ? ?Current Meds  ?Medication Sig  ? ALPRAZolam (XANAX) 0.25 MG tablet Take 1 tablet (0.25 mg total) by mouth 2 (two) times daily as needed for anxiety.  ? amLODipine (NORVASC) 2.5 MG tablet Take 1 tablet (2.5 mg total) by mouth daily.  ? escitalopram (LEXAPRO) 20 MG tablet TAKE 1 TABLET BY MOUTH EVERY DAY  ? meloxicam (MOBIC) 15 MG tablet TAKE 1 TABLET (15 MG TOTAL) BY MOUTH DAILY.  ? ? ? ?  09/23/2021  ?  9:43 AM 08/26/2021  ? 10:46 AM 05/08/2021  ?  4:00 PM 03/20/2021  ? 11:46 AM  ?GAD 7 : Generalized Anxiety Score  ?Nervous, Anxious, on Edge 1 0 0 0  ?Control/stop worrying 1 0 0 1  ?Worry too much - different things 1 0 0 1  ?Trouble relaxing 1 0 0 0  ?Restless 1 0 0 0  ?Easily annoyed or irritable 1 0 0 0  ?Afraid - awful might happen 0 0 0 0  ?Total GAD 7 Score 6 0 0 2  ?Anxiety Difficulty Very difficult Not difficult at all Not difficult at all Not difficult at all  ? ? ? ?  09/23/2021  ?  9:42 AM  ?Depression screen PHQ 2/9  ?Decreased Interest 0  ?Down, Depressed, Hopeless 1  ?PHQ - 2 Score 1  ?Altered sleeping 1  ?Tired,  decreased energy 0  ?Change in appetite 0  ?Feeling bad or failure about yourself  0  ?Trouble concentrating 0  ?Moving slowly or fidgety/restless 0  ?Suicidal thoughts 0  ?PHQ-9 Score 2  ?Difficult doing work/chores Very difficult  ? ? ?BP Readings from Last 3 Encounters:  ?09/23/21 130/90  ?08/26/21 (!) 144/93  ?05/08/21 (!) 140/100  ? ? ?Physical Exam ?Vitals and nursing note reviewed.  ?Constitutional:   ?   General: He is not irritable. ?HENT:  ?   Head: Normocephalic.  ?   Right Ear: Tympanic membrane and external ear normal.  ?   Left Ear: Tympanic membrane and external ear normal.  ?   Nose: Nose normal. No congestion or rhinorrhea.  ?Eyes:  ?   General: No scleral icterus.    ?   Right eye: No  discharge.     ?   Left eye: No discharge.  ?   Conjunctiva/sclera: Conjunctivae normal.  ?   Pupils: Pupils are equal, round, and reactive to light.  ?Neck:  ?   Thyroid: No thyromegaly.  ?   Vascular: No JVD.  ?   Trachea: No tracheal deviation.  ?Cardiovascular:  ?   Rate and Rhythm: Normal rate and regular rhythm.  ?   Heart sounds: Normal heart sounds. No murmur heard. ?  No friction rub. No gallop.  ?Pulmonary:  ?   Effort: No respiratory distress.  ?   Breath sounds: Normal breath sounds. No wheezing, rhonchi or rales.  ?Abdominal:  ?   General: Bowel sounds are normal.  ?   Palpations: Abdomen is soft. There is no mass.  ?   Tenderness: There is no abdominal tenderness. There is no guarding or rebound.  ?Musculoskeletal:     ?   General: No tenderness. Normal range of motion.  ?   Cervical back: Normal range of motion and neck supple.  ?Lymphadenopathy:  ?   Cervical: No cervical adenopathy.  ?Skin: ?   General: Skin is warm.  ?   Findings: No rash.  ?Neurological:  ?   Mental Status: He is alert and oriented to person, place, and time.  ?   Cranial Nerves: No cranial nerve deficit.  ?   Deep Tendon Reflexes: Reflexes are normal and symmetric.  ? ? ?Wt Readings from Last 3 Encounters:  ?09/23/21 192 lb (87.1  kg)  ?08/26/21 195 lb (88.5 kg)  ?05/08/21 197 lb (89.4 kg)  ? ? ?BP 130/90   Pulse 68   Ht _0  (1.778 m)   Wt 192 lb (87.1 kg)   BMI 27.55 kg/m?  ? ?Assessment and Plan: ?1. Cervical radiculopathy ?[facility]

## 2021-10-30 ENCOUNTER — Ambulatory Visit
Admission: RE | Admit: 2021-10-30 | Discharge: 2021-10-30 | Disposition: A | Discharge status: Home / Self Care | Payer: No Typology Code available for payment source | Source: Ambulatory Visit | Attending: Family Medicine | Admitting: Family Medicine

## 2021-10-30 ENCOUNTER — Ambulatory Visit
Admission: RE | Admit: 2021-10-30 | Discharge: 2021-10-30 | Disposition: A | Discharge status: Home / Self Care | Payer: No Typology Code available for payment source | Attending: Family Medicine | Admitting: Family Medicine

## 2021-10-30 ENCOUNTER — Encounter: Payer: Self-pay | Admitting: Family Medicine

## 2021-10-30 ENCOUNTER — Ambulatory Visit: Payer: No Typology Code available for payment source | Admitting: Family Medicine

## 2021-10-30 VITALS — BP 122/80 | HR 80 | Ht 70.0 in | Wt 186.0 lb

## 2021-10-30 DIAGNOSIS — M5412 Radiculopathy, cervical region: Secondary | ICD-10-CM | POA: Insufficient documentation

## 2021-10-30 NOTE — Progress Notes (Signed)
Date:  10/30/2021   Name:  Derek Blake   DOB:  1960-05-29   MRN:  [id number]   Chief Complaint: Neck Pain  Neck Pain  This is a chronic problem. The current episode started more than 1 month ago (3 months). The problem has been gradually worsening. Associated with: degenerative disease. The pain is present in the left side and right side. The quality of the pain is described as aching and burning. The symptoms are aggravated by bending, twisting and position. The pain is Worse during the night. Associated symptoms include numbness, tingling and weakness. Pertinent negatives include no chest pain or headaches. Associated symptoms comments: Radicular pain. He has tried NSAIDs for the symptoms. The treatment provided mild relief.    Lab Results  Component Value Date   NA 139 08/26/2021   K 4.3 08/26/2021   CO2 24 08/26/2021   GLUCOSE 101 (H) 08/26/2021   BUN 9 08/26/2021   CREATININE 0.92 08/26/2021   CALCIUM 8.8 08/26/2021   EGFR 96 08/26/2021   GFRNONAA 68 10/30/2019   Lab Results  Component Value Date   CHOL 204 (H) 08/26/2021   HDL 54 08/26/2021   LDLCALC 138 (H) 08/26/2021   TRIG 67 08/26/2021   CHOLHDL 5.2 (H) 03/02/2016   Lab Results  Component Value Date   TSH 2.380 10/30/2019   Lab Results  Component Value Date   HGBA1C 5.5 10/30/2019   Lab Results  Component Value Date   WBC 7.3 10/30/2019   HGB 16.2 10/30/2019   HCT 46.9 10/30/2019   MCV 96 10/30/2019   PLT 279 10/30/2019   Lab Results  Component Value Date   ALT 15 08/26/2021   AST 17 08/26/2021   ALKPHOS 91 08/26/2021   BILITOT 0.2 08/26/2021   No results found for: "25OHVITD2", "25OHVITD3", "VD25OH"   Review of Systems  Respiratory:  Negative for shortness of breath.   Cardiovascular:  Negative for chest pain, palpitations and leg swelling.  Musculoskeletal:  Positive for neck pain and neck stiffness.  Neurological:  Positive for tingling, weakness and numbness. Negative for headaches.     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  Current Meds  Medication Sig   ALPRAZolam (XANAX) 0.25 MG tablet Take 1 tablet (0.25 mg total) by mouth 2 (two) times daily as needed for anxiety.   amLODipine (NORVASC) 5 MG tablet Take 1 tablet (5 mg total) by mouth daily.   escitalopram (  LEXAPRO) 20 MG tablet TAKE 1 TABLET BY MOUTH EVERY DAY   meloxicam (MOBIC) 15 MG tablet Take 1 tablet (15 mg total) by mouth daily.       09/23/2021    9:43 AM 08/26/2021   10:46 AM 05/08/2021    4:00 PM 03/20/2021   11:46 AM  GAD 7 : Generalized Anxiety Score  Nervous, Anxious, on Edge 1 0 0 0  Control/stop worrying 1 0 0 1  Worry too much - different things 1 0 0 1  Trouble relaxing 1 0 0 0  Restless 1 0 0 0  Easily annoyed or irritable 1 0 0 0  Afraid - awful might happen 0 0 0 0  Total GAD 7 Score 6 0 0 2  Anxiety Difficulty Very difficult Not difficult at all Not difficult at all Not difficult at all       09/23/2021    9:42 AM  Depression screen PHQ 2/9   Decreased Interest 0  Down, Depressed, Hopeless 1  PHQ - 2 Score 1  Altered sleeping 1  Tired, decreased energy 0  Change in appetite 0  Feeling bad or failure about yourself  0  Trouble concentrating 0  Moving slowly or fidgety/restless 0  Suicidal thoughts 0  PHQ-9 Score 2  Difficult doing work/chores Very difficult    BP Readings from Last 3 Encounters:  10/30/21 122/80  09/23/21 (!) 146/92  08/26/21 (!) 144/93    Physical Exam Vitals and nursing note reviewed.  HENT:     Head: Normocephalic.     Right Ear: Tympanic membrane and external ear normal.     Left Ear: Tympanic membrane and external ear normal.     Nose: Nose normal. No congestion or rhinorrhea.  Eyes:     General: No scleral icterus.       Right eye: No discharge.        Left eye: No discharge.     Conjunctiva/sclera: Conjunctivae normal.     Pupils: Pupils are equal, round, and reactive to light.  Neck:     Thyroid: No thyromegaly.     Vascular: No JVD.     Trachea: No tracheal deviation.  Cardiovascular:     Rate and Rhythm: Normal rate and regular rhythm.     Heart sounds: Normal heart sounds. No murmur heard.    No friction rub. No gallop.  Pulmonary:     Effort: No respiratory distress.     Breath sounds: Normal breath sounds. No wheezing, rhonchi or rales.  Abdominal:     General: Bowel sounds are normal.     Palpations: Abdomen is soft. There is no mass.     Tenderness: There is no abdominal tenderness. There is no guarding or rebound.  Musculoskeletal:        General: No tenderness. Normal range of motion.     Cervical back: Normal range of motion and neck supple.  Lymphadenopathy:     Cervical: No cervical adenopathy.  Skin:    General: Skin is warm.     Findings: No rash.  Neurological:     General: No focal deficit present.     Mental Status: He is alert and oriented to person, place, and time.     Cranial Nerves: No cranial nerve deficit.     Deep Tendon Reflexes: Reflexes are  normal and symmetric.   Proprioception concern  Wt Readings from Last 3 Encounters:  10/30/21 186 lb (84.4 kg)  09/23/21 192 lb (87.1 kg)  08/26/21 195 lb (88.5 kg)    BP 122/80   Pulse 80   Ht 5' 10" (1.778 m)   Wt 186 lb (84.4 kg)   BMI 26.69 kg/m   Assessment and Plan: 1. Cervical radiculopathy Chronic.  Persistent.  Currently stable as far as neurologically but the persistence of pain is now becoming an issue with daily activities.  Patient has had previous cervical disc surgery in 2019 with Dr. Sharyne Richters and symptoms is similar to previous presentation of cervical disc disease.  There is pain that is radicular going down both arms and with paresthesias and some weakness as well.  This would suggest some level of stenosis and we will refer to previous orthopedics that did his surgery in the meantime we will obtain a cervical x-ray to rule out any osteolytic osteoblastic concerns or compression fracture. - Ambulatory referral to Orthopedic Surgery - DG Cervical Spine Complete; Future

## 2021-12-29 ENCOUNTER — Other Ambulatory Visit: Payer: Self-pay

## 2021-12-29 DIAGNOSIS — R030 Elevated blood-pressure reading, without diagnosis of hypertension: Secondary | ICD-10-CM

## 2021-12-29 MED ORDER — AMLODIPINE BESYLATE 5 MG PO TABS: 5.0000 mg | ORAL_TABLET | Freq: Every day | ORAL | 0 refills | Status: DC

## 2022-01-09 ENCOUNTER — Other Ambulatory Visit: Payer: Self-pay | Admitting: Family Medicine

## 2022-01-09 DIAGNOSIS — R030 Elevated blood-pressure reading, without diagnosis of hypertension: Secondary | ICD-10-CM

## 2022-01-16 ENCOUNTER — Telehealth: Payer: Self-pay

## 2022-01-16 NOTE — Telephone Encounter (Signed)
Called and could not leave message "mailbox is full"- flu shots are here

## 2022-02-20 ENCOUNTER — Ambulatory Visit: Payer: No Typology Code available for payment source | Admitting: Family Medicine

## 2022-02-26 ENCOUNTER — Encounter: Payer: Self-pay | Admitting: Family Medicine

## 2022-05-04 ENCOUNTER — Ambulatory Visit: Payer: No Typology Code available for payment source | Admitting: Family Medicine

## 2022-05-04 ENCOUNTER — Ambulatory Visit: Payer: Self-pay

## 2022-05-04 NOTE — Telephone Encounter (Signed)
  Chief Complaint: back pain Symptoms: lower back pain and neck pain 8/10, constant  Frequency: 1 month  Pertinent Negatives: NA Disposition: [] ED /[] Urgent Care (no appt availability in office) / [x] Appointment(In office/virtual)/ []  [facility]/ [] Home Care/ [] Refused Recommended Disposition /[] [facility]/ []  Follow-up with PCP Additional Notes: pt calling upset and in tears because of pain, states been taking Ibuprofen and Tylenol and not helping. Was suppose to see PCP 1 month ago but d/t taking care of sick wife was unable to make appt. Scheduled appt today at 1620 with PCP. Advised pt to try and rest prior to appt.   Reason for Disposition  [1] SEVERE back pain (e.g., excruciating, unable to do any normal activities) AND [2] not improved 2 hours after pain medicine  Answer Assessment - Initial Assessment Questions 1. ONSET: "When did the pain begin?"      1 month  2. LOCATION: "Where does it hurt?" (upper, mid or lower back)     Lower back and neck  3. SEVERITY: "How bad is the pain?"  (e.g., Scale 1-10; mild, moderate, or severe)   - MILD (1-3): Doesn't interfere with normal activities.    - MODERATE (4-7): Interferes with normal activities or awakens from sleep.    - SEVERE (8-10): Excruciating pain, unable to do any normal activities.      8/10 4. PATTERN: "Is the pain constant?" (e.g., yes, no; constant, intermittent)      Constant  8. MEDICINES: "What have you taken so far for the pain?" (e.g., nothing, acetaminophen, NSAIDS)     Ibuprofen and Tylenol  10. OTHER SYMPTOMS: "Do you have any other symptoms?" (e.g., fever, abdomen pain, burning with urination, blood in urine)  Protocols used: Back Pain-A-AH

## 2022-06-23 ENCOUNTER — Ambulatory Visit (INDEPENDENT_AMBULATORY_CARE_PROVIDER_SITE_OTHER): Payer: No Typology Code available for payment source | Admitting: Family Medicine

## 2022-06-23 ENCOUNTER — Encounter: Payer: Self-pay | Admitting: Family Medicine

## 2022-06-23 VITALS — BP 130/80 | HR 97 | Ht 70.0 in | Wt 193.0 lb

## 2022-06-23 DIAGNOSIS — F419 Anxiety disorder, unspecified: Secondary | ICD-10-CM

## 2022-06-23 DIAGNOSIS — F32A Depression, unspecified: Secondary | ICD-10-CM | POA: Diagnosis not present

## 2022-06-23 DIAGNOSIS — E785 Hyperlipidemia, unspecified: Secondary | ICD-10-CM

## 2022-06-23 DIAGNOSIS — R030 Elevated blood-pressure reading, without diagnosis of hypertension: Secondary | ICD-10-CM | POA: Diagnosis not present

## 2022-06-23 MED ORDER — BUSPIRONE HCL 7.5 MG PO TABS: 7.5000 mg | ORAL_TABLET | Freq: Two times a day (BID) | ORAL | 1 refills | Status: DC

## 2022-06-23 MED ORDER — ESCITALOPRAM OXALATE 20 MG PO TABS: ORAL_TABLET | ORAL | 1 refills | Status: DC

## 2022-06-23 MED ORDER — AMLODIPINE BESYLATE 5 MG PO TABS: ORAL_TABLET | ORAL | 1 refills | Status: DC

## 2022-06-23 NOTE — Progress Notes (Unsigned)
Date:  06/23/2022   Name:  Derek Blake   DOB:  12-17-1960   MRN:  [id number]   Chief Complaint: Hypertension, Depression, and Anxiety  Hypertension This is a chronic problem. The current episode started more than 1 month ago. The problem has been gradually improving since onset. The problem is controlled. Associated symptoms include anxiety. Pertinent negatives include no chest pain, headaches, neck pain, orthopnea, palpitations, PND or shortness of breath. Past treatments include calcium channel blockers. There are no compliance problems.  There is no history of angina, kidney disease, CAD/MI, CVA, heart failure, left ventricular hypertrophy, PVD or retinopathy. There is no history of chronic renal disease, a hypertension causing med or renovascular disease.  Depression        This is a chronic problem.  The current episode started more than 1 year ago.   The onset quality is sudden.   The problem occurs intermittently.  The problem has been gradually improving since onset.  Associated symptoms include decreased concentration, fatigue, helplessness, hopelessness, insomnia, irritable, restlessness, decreased interest and sad.  Associated symptoms include no appetite change, no body aches, no myalgias, no headaches, no indigestion and no suicidal ideas.  Past treatments include SSRIs - Selective serotonin reuptake inhibitors.  Previous treatment provided moderate relief.  Past medical history includes anxiety.   Anxiety Presents for follow-up visit. Symptoms include decreased concentration, insomnia and restlessness. Patient reports no chest pain, depressed mood, dizziness, excessive worry, impotence, irritability, nausea, nervous/anxious behavior, palpitations, shortness of breath or suicidal ideas. Symptoms occur occasionally.      Lab Results  Component Value Date   NA 139 08/26/2021   K 4.3 08/26/2021   CO2 24 08/26/2021   GLUCOSE 101 (H) 08/26/2021   BUN 9 08/26/2021   CREATININE  0.92 08/26/2021   CALCIUM 8.8 08/26/2021   EGFR 96 08/26/2021   GFRNONAA 68 10/30/2019   Lab Results  Component Value Date   CHOL 204 (H) 08/26/2021   HDL 54 08/26/2021   LDLCALC 138 (H) 08/26/2021   TRIG 67 08/26/2021   CHOLHDL 5.2 (H) 03/02/2016   Lab Results  Component Value Date   TSH 2.380 10/30/2019   Lab Results  Component Value Date   HGBA1C 5.5 10/30/2019   Lab Results  Component Value Date   WBC 7.3 10/30/2019   HGB 16.2 10/30/2019   HCT 46.9 10/30/2019   MCV 96 10/30/2019   PLT 279 10/30/2019   Lab Results  Component Value Date   ALT 15 08/26/2021   AST 17 08/26/2021   ALKPHOS 91 08/26/2021   BILITOT 0.2 08/26/2021   No results found for: "25OHVITD2", "25OHVITD3", "VD25OH"   Review of Systems  Constitutional:  Positive for fatigue. Negative for appetite change, chills, fever and irritability.  HENT:  Negative for drooling, ear discharge, ear pain and sore throat.   Respiratory:  Negative for cough, shortness of breath and wheezing.   Cardiovascular:  Negative for chest pain, palpitations, orthopnea, leg swelling and PND.  Gastrointestinal:  Negative for abdominal pain, blood in stool, constipation, diarrhea and nausea.  Endocrine: Negative for polydipsia.  Genitourinary:  Negative for dysuria, frequency, hematuria, impotence and urgency.  Musculoskeletal:  Negative for back pain, myalgias and neck pain.  Skin:  Negative for rash.  Allergic/Immunologic: Negative for environmental allergies.  Neurological:  Negative for dizziness and headaches.  Hematological:  Does not bruise/bleed easily.  Psychiatric/Behavioral:  Positive for decreased concentration and depression. Negative for suicidal ideas. The patient has insomnia. The  patient is not nervous/anxious.     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  Current Meds  Medication Sig   amLODipine (NORVASC) 5 MG tablet TAKE 1 TABLET(5 MG) BY MOUTH DAILY   escitalopram (LEXAPRO) 20 MG tablet TAKE 1 TABLET BY MOUTH EVERY DAY       06/23/2022    4:31 PM 09/23/2021    9:43 AM 08/26/2021   10:46 AM 05/08/2021    4:00 PM  GAD 7 : Generalized Anxiety Score  Nervous, Anxious, on Edge 3 1 0 0  Control/stop worrying 3 1 0 0  Worry too much - different things 3 1 0 0  Trouble relaxing 2 1 0 0  Restless 2 1 0 0  Easily annoyed or irritable 2 1 0 0  Afraid - awful might happen 2 0 0 0  Total GAD 7 Score 17 6 0 0  Anxiety Difficulty Somewhat difficult Very difficult Not difficult at all Not difficult at all       06/23/2022    4:30 PM 09/23/2021    9:42 AM 08/26/2021   10:46 AM  Depression screen PHQ 2/9  Decreased Interest 3 0 0  Down, Depressed, Hopeless 3 1 2   PHQ - 2 Score 6 1 2   Altered sleeping 3 1 2   Tired, decreased energy 3 0 2  Change in appetite 3 0 0  Feeling bad or failure about yourself  1 0 0  Trouble concentrating 1 0 0  Moving slowly or fidgety/restless 2 0 0  Suicidal thoughts 0 0 0  PHQ-9 Score 19 2 6   Difficult doing work/chores  Somewhat difficult Very difficult Not difficult at all    BP Readings from Last 3 Encounters:  06/23/22 130/80  10/30/21 122/80  09/23/21 (!) 146/92    Physical Exam Vitals and nursing note reviewed.  Constitutional:      General: He is irritable.  HENT:     Head: Normocephalic.     Right Ear: Tympanic membrane and external ear normal.     Left Ear: Tympanic membrane and external ear normal.     Nose: Nose normal. No congestion or rhinorrhea.     Mouth/Throat:     Mouth: Mucous membranes are moist.  Eyes:     General: No scleral icterus.       Right eye: No discharge.        Left eye: No discharge.     Conjunctiva/sclera:  Conjunctivae normal.     Pupils: Pupils are equal, round, and reactive to light.  Neck:     Thyroid: No thyromegaly.     Vascular: No JVD.     Trachea: No tracheal deviation.  Cardiovascular:     Rate and Rhythm: Normal rate and regular rhythm.     Heart sounds: Normal heart sounds. No murmur heard.    No friction rub. No gallop.  Pulmonary:     Effort: No respiratory distress.     Breath sounds: Normal breath sounds. No wheezing, rhonchi or rales.  Abdominal:     General: Bowel sounds are normal.     Palpations: Abdomen is soft. There is no mass.     Tenderness: There is no abdominal tenderness. There is no guarding or rebound.  Musculoskeletal:        General: No tenderness. Normal range of motion.     Cervical back: Normal range of motion and neck supple.  Lymphadenopathy:     Cervical: No cervical adenopathy.  Skin:    General: Skin is warm.     Findings: No rash.  Neurological:     Mental Status: He is alert and oriented to person, place, and time.     Cranial Nerves: No cranial nerve deficit.     Deep Tendon Reflexes: Reflexes are normal and symmetric.     Wt Readings from Last 3 Encounters:  06/23/22 193 lb (87.5 kg)  10/30/21 186 lb (84.4 kg)  09/23/21 192 lb (87.1 kg)    BP 130/80   Pulse 97   Ht 5\' 10"  (1.778 m)   Wt 193 lb  (87.5 kg)   SpO2 98%   BMI 27.69 kg/m   Assessment and Plan:  1. Elevated blood pressure, situational Chronic.  Controlled.  Stable.  Blood pressure today 130/80.  Continue amlodipine 5 mg once a day. - amLODipine (NORVASC) 5 MG tablet; TAKE 1 TABLET(5 MG) BY MOUTH DAILY  Dispense: 90 tablet; Refill: 1 - Comprehensive Metabolic Panel (CMET)  2. Anxiety and depression Chronic.  Uncontrolled.  Stable.  PHQ is 19 GAD score is 17 we will continue escitalopram 20 mg a day but add buspirone 7.5 mg twice a day will recheck patient in 4 to 6 weeks. - escitalopram (LEXAPRO) 20 MG tablet; TAKE 1 TABLET BY MOUTH EVERY DAY  Dispense: 90 tablet; Refill: 1 - busPIRone (BUSPAR) 7.5 MG tablet; Take 1 tablet (7.5 mg total) by mouth 2 (two) times daily.  Dispense: 60 tablet; Refill: 1  3. Dyslipidemia Chronic.  Diet controlled.  Stable.  Will check lipid panel and adjust accordingly. - Lipid Panel With LDL/HDL Ratio    Otilio Miu, MD

## 2022-06-24 ENCOUNTER — Encounter: Payer: Self-pay | Admitting: Family Medicine

## 2022-06-24 LAB — COMPREHENSIVE METABOLIC PANEL
ALT: 12 IU/L (ref 0–44)
AST: 16 IU/L (ref 0–40)
Albumin/Globulin Ratio: 1.8 (ref 1.2–2.2)
Albumin: 4.7 g/dL (ref 3.8–4.9)
Alkaline Phosphatase: 95 IU/L (ref 44–121)
BUN/Creatinine Ratio: 9 — ABNORMAL LOW (ref 10–24)
BUN: 11 mg/dL (ref 8–27)
Bilirubin Total: 0.6 mg/dL (ref 0.0–1.2)
CO2: 22 mmol/L (ref 20–29)
Calcium: 9.6 mg/dL (ref 8.6–10.2)
Chloride: 99 mmol/L (ref 96–106)
Creatinine, Ser: 1.26 mg/dL (ref 0.76–1.27)
Globulin, Total: 2.6 g/dL (ref 1.5–4.5)
Glucose: 97 mg/dL (ref 70–99)
Potassium: 4.3 mmol/L (ref 3.5–5.2)
Sodium: 138 mmol/L (ref 134–144)
Total Protein: 7.3 g/dL (ref 6.0–8.5)
eGFR: 65 mL/min/{1.73_m2} (ref >59)

## 2022-06-24 LAB — LIPID PANEL WITH LDL/HDL RATIO
Cholesterol, Total: 206 mg/dL — ABNORMAL HIGH (ref 100–199)
HDL: 47 mg/dL (ref >39)
LDL Chol Calc (NIH): 129 mg/dL — ABNORMAL HIGH (ref 0–99)
LDL/HDL Ratio: 2.7 ratio (ref 0.0–3.6)
Triglycerides: 171 mg/dL — ABNORMAL HIGH (ref 0–149)
VLDL Cholesterol Cal: 30 mg/dL (ref 5–40)

## 2022-06-25 NOTE — Progress Notes (Signed)
Attempted to reach pt, VM full, unable to leave message.

## 2022-08-04 ENCOUNTER — Ambulatory Visit: Payer: No Typology Code available for payment source | Admitting: Family Medicine

## 2022-08-07 ENCOUNTER — Ambulatory Visit: Payer: No Typology Code available for payment source | Admitting: Family Medicine

## 2022-08-10 ENCOUNTER — Encounter: Payer: Self-pay | Admitting: Family Medicine

## 2022-08-10 ENCOUNTER — Ambulatory Visit: Payer: No Typology Code available for payment source | Admitting: Family Medicine

## 2022-08-10 VITALS — BP 120/98 | HR 100 | Ht 70.0 in | Wt 188.0 lb

## 2022-08-10 DIAGNOSIS — R2689 Other abnormalities of gait and mobility: Secondary | ICD-10-CM

## 2022-08-10 DIAGNOSIS — M545 Low back pain, unspecified: Secondary | ICD-10-CM | POA: Diagnosis not present

## 2022-08-10 DIAGNOSIS — S060X0A Concussion without loss of consciousness, initial encounter: Secondary | ICD-10-CM | POA: Diagnosis not present

## 2022-08-10 NOTE — Progress Notes (Incomplete Revision)
Date:  08/10/2022   Name:  Derek Blake   DOB:  May 31, 1960   MRN:  [id number]   Chief Complaint: Derek Blake [address] on 08/08/22- hit head on 2x4 table leg. "Almost went out and I feel like I have a buzz from drinking") and Back Pain (Referral back to Dr Derek Blake)  Derek Blake The accident occurred 2 days ago. Impact surface: 4x4 on table. There was no blood loss. The point of impact was the head. The pain is present in the head. The pain is moderate. Associated symptoms include headaches and numbness. Pertinent negatives include no bowel incontinence, hearing loss, loss of consciousness, nausea, tingling, visual change or vomiting. The treatment provided mild relief.  Back Pain This is a new problem. The current episode started in the past 7 days (twisting injury with fall). The problem has been gradually improving since onset. The pain is present in the lumbar spine. The quality of the pain is described as aching. The pain is moderate. Associated symptoms include headaches and numbness. Pertinent negatives include no bowel incontinence or tingling. (Ulna distribution bilateral)    Lab Results  Component Value Date   NA 138 06/23/2022   K 4.3 06/23/2022   CO2 22 06/23/2022   GLUCOSE 97 06/23/2022   BUN 11 06/23/2022   CREATININE 1.26 06/23/2022   CALCIUM 9.6 06/23/2022   EGFR 65 06/23/2022   GFRNONAA 68 10/30/2019   Lab Results  Component Value Date   CHOL 206 (H) 06/23/2022   HDL 47 06/23/2022   LDLCALC 129 (H) 06/23/2022   TRIG 171 (H) 06/23/2022   CHOLHDL 5.2 (H) 03/02/2016   Lab Results  Component Value Date   TSH 2.380 10/30/2019   Lab Results  Component Value Date   HGBA1C 5.5 10/30/2019   Lab Results  Component Value Date   WBC 7.3 10/30/2019   HGB 16.2 10/30/2019   HCT 46.9 10/30/2019   MCV 96 10/30/2019   PLT 279 10/30/2019   Lab Results  Component Value Date   ALT 12 06/23/2022   AST 16 06/23/2022   ALKPHOS 95 06/23/2022   BILITOT 0.6 06/23/2022   No results  found for: "25OHVITD2", "25OHVITD3", "VD25OH"   Review of Systems  Gastrointestinal:  Negative for bowel incontinence, nausea and vomiting.  Musculoskeletal:  Positive for back pain.  Neurological:  Positive for numbness and headaches. Negative for tingling and loss of consciousness.    Patient Active Problem List   Diagnosis Date Noted  . Left lateral epicondylitis 05/08/2021  . Medial epicondylitis of elbow, right 05/08/2021  . Anxiety 02/14/2019  . Overweight 07/18/2018  . Cervical radiculopathy 10/28/2017  . Postoperative anemia due to acute blood loss 03/06/2016  . Anxiety and depression 07/12/2015  . Bilateral sciatica 07/12/2015  . 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 Status post lumbar spine surgery for decompression of spinal cord 07/12/2015  . Lumbar nerve root compression 07/04/2015    No Known Allergies  Past Surgical History:  Procedure Laterality Date  . BACK SURGERY    . COLONOSCOPY  2016   cleared for 10 yrs- Dr Candace Cruise  . NASAL SEPTUM SURGERY    . TONSILLECTOMY      Social History   Tobacco Use  . Smoking status: Former  . Smokeless tobacco: Never  Substance Use Topics  . Alcohol use: Yes    Alcohol/week: 0.0 standard drinks of alcohol  . Drug use: No     Medication list has been reviewed  and updated.  Current Meds  Medication Sig  . amLODipine (NORVASC) 5 MG tablet TAKE 1 TABLET(5 MG) BY MOUTH DAILY  . busPIRone (BUSPAR) 7.5 MG tablet Take 1 tablet (7.5 mg total) by mouth 2 (two) times daily.  Marland Kitchen escitalopram (LEXAPRO) 20 MG tablet TAKE 1 TABLET BY MOUTH EVERY DAY       08/10/2022    3:02 PM 06/23/2022    4:31 PM 09/23/2021    9:43 AM 08/26/2021   10:46 AM  GAD 7 : Generalized Anxiety Score  Nervous, Anxious, on Edge 0 3 1 0  Control/stop worrying 0 3 1 0  Worry too much - different things 0 3 1 0  Trouble relaxing 0 2 1 0  Restless 0 2 1 0  Easily annoyed or irritable 0 2 1 0  Afraid - awful might happen  0 2 0 0  Total GAD 7 Score 0 17 6 0  Anxiety Difficulty Not difficult at all Somewhat difficult Very difficult Not difficult at all       08/10/2022    3:02 PM 06/23/2022    4:30 PM 09/23/2021    9:42 AM  Depression screen PHQ 2/9  Decreased Interest 0 3 0  Down, Depressed, Hopeless 0 3 1  PHQ - 2 Score 0 6 1  Altered sleeping 0 3 1  Tired, decreased energy 0 3 0  Change in appetite 0 3 0  Feeling bad or failure about yourself  0 1 0  Trouble concentrating 0 1 0  Moving slowly or fidgety/restless 0 2 0  Suicidal thoughts 0 0 0  PHQ-9 Score 0 19 2  Difficult doing work/chores Not difficult at all Somewhat difficult Very difficult    BP Readings from Last 3 Encounters:  08/10/22 (!) 120/98  06/23/22 130/80  10/30/21 122/80    Physical Exam Vitals reviewed.  Constitutional:      Appearance: Normal appearance.  HENT:     Head: Normocephalic.     Right Ear: Tympanic membrane, ear canal and external ear normal. There is no impacted cerumen.     Left Ear: Tympanic membrane, ear canal and external ear normal. There is no impacted cerumen.     Nose: Nose normal.     Mouth/Throat:     Mouth: Mucous membranes are moist.  Eyes:     General:        Right eye: No discharge.        Left eye: No discharge.     Conjunctiva/sclera: Conjunctivae normal.  Neck:     Vascular: No carotid bruit.  Cardiovascular:     Rate and Rhythm: Normal rate.  Pulmonary:     Breath sounds: No wheezing, rhonchi or rales.  Musculoskeletal:     Cervical back: Normal range of motion. No rigidity or tenderness.     Lumbar back: Spasms present. No tenderness or bony tenderness. Positive right straight leg raise test and positive left straight leg raise test.  Lymphadenopathy:     Cervical: No cervical adenopathy.  Neurological:     Mental Status: He is alert.     Cranial Nerves: Cranial nerves 2-12 are intact.     Motor: No weakness.     Coordination: Romberg sign positive.    Wt Readings from Last  3 Encounters:  08/10/22 188 lb (85.3 kg)  06/23/22 193 lb (87.5 kg)  10/30/21 186 lb (84.4 kg)    BP (!) 120/98 (BP Location: Left Arm, Cuff Size: Large)   Pulse  100   Ht 5\' 10"  (1.778 m)   Wt 188 lb (85.3 kg)   SpO2 98%   BMI 26.98 kg/m   Assessment and Plan: 1. Concussion without loss of consciousness, initial encounter New onset.  Initial evaluation after patient fell and hit his head on a piece of wood that was on the table.  Patient has had some localized head discomfort to the back of the head.  Patient states that he is baseline with his concentration.  There is been some balance issues but he has been having issues with this in the past.  There was no loss of consciousness.  Examination notes normal cranial nerves, decreased sensation ulnar nerves bilateral which patient says is baseline, and no loss of motor bilateral.  Patient is unable to maintain balance when close eyes and has a positive Romberg.  Patient has been told that he needs to go to the hospital for follow-up and likely CT of the head.  Patient refuses to go.  I have instructed that he may take Tylenol for headache with no other further analgesic and is to go to the hospital if symptoms persist or progress.  2. Balance disorder As noted above patient has some issues with balance and is having trouble with maintaining balance when walking.  I do not know if this is his baseline or if this is worsened since his fall.  Patient has been told that he should go to the hospital but refuses to do so.  3. Acute bilateral low back pain, unspecified whether sciatica present Patient had a twisting injury and the reason he came today is to get a referral back to Dr. Regenia Skeeter and orthopedics because of his back and previous surgery.  We have placed a referral for this and in the meantime patient is been told that given the above circumstances that Tylenol would be the preferred analgesic until he is properly evaluated. - Ambulatory  referral to Orthopedic Surgery   Patient also related to the nurse he is having difficulty with finding certain words which she has not had in the past and for this reason I have also told patient he needs to go to the hospital to be evaluated but he refuses to do so  Otilio Miu, MD

## 2022-08-10 NOTE — Progress Notes (Addendum)
Date:  08/10/2022   Name:  Derek Blake   DOB:  03-06-1961   MRN:  [id number]   Chief Complaint: [name] on 08/08/22- hit head on 2x4 table leg. "Almost went out and I feel like I have a buzz from drinking") and Back Pain (Referral back to Dr Velna Ochs)  [name] The accident occurred 2 days ago. Impact surface: 4x4 on table. There was no blood loss. The point of impact was the head. The pain is present in the head. The pain is moderate. Associated symptoms include headaches and numbness. Pertinent negatives include no bowel incontinence, hearing loss, loss of consciousness, nausea, tingling, visual change or vomiting. The treatment provided mild relief.  Back Pain This is a new problem. The current episode started in the past 7 days (twisting injury with fall). The problem has been gradually improving since onset. The pain is present in the lumbar spine. The quality of the pain is described as aching. The pain is moderate. Associated symptoms include headaches and numbness. Pertinent negatives include no bowel incontinence or tingling. (Ulna distribution bilateral)    Lab Results  Component Value Date   NA 138 06/23/2022   K 4.3 06/23/2022   CO2 22 06/23/2022   GLUCOSE 97 06/23/2022   BUN 11 06/23/2022   CREATININE 1.26 06/23/2022   CALCIUM 9.6 06/23/2022   EGFR 65 06/23/2022   GFRNONAA 68 10/30/2019   Lab Results  Component Value Date   CHOL 206 (H) 06/23/2022   HDL 47 06/23/2022   LDLCALC 129 (H) 06/23/2022   TRIG 171 (H) 06/23/2022   CHOLHDL 5.2 (H) 03/02/2016   Lab Results  Component Value Date   TSH 2.380 10/30/2019   Lab Results  Component Value Date   HGBA1C 5.5 10/30/2019   Lab Results  Component Value Date   WBC 7.3 10/30/2019   HGB 16.2 10/30/2019   HCT 46.9 10/30/2019   MCV 96 10/30/2019   PLT 279 10/30/2019   Lab Results  Component Value Date   ALT 12 06/23/2022   AST 16 06/23/2022   ALKPHOS 95 06/23/2022   BILITOT 0.6 06/23/2022   No results  found for: "25OHVITD2", "25OHVITD3", "VD25OH"   Review of Systems  Gastrointestinal:  Negative for bowel incontinence, nausea and vomiting.  Musculoskeletal:  Positive for back pain.  Neurological:  Positive for numbness and headaches. Negative for tingling and loss of consciousness.    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  Current Meds  Medication Sig   amLODipine (NORVASC) 5 MG tablet TAKE 1 TABLET(5 MG) BY MOUTH DAILY   busPIRone (BUSPAR) 7.5 MG tablet Take 1 tablet (7.5 mg total) by mouth 2 (two) times daily.   escitalopram (LEXAPRO) 20 MG tablet TAKE 1 TABLET BY MOUTH EVERY DAY       08/10/2022    3:02 PM 06/23/2022    4:31 PM 09/23/2021    9:43 AM 08/26/2021   10:46 AM  GAD 7 : Generalized Anxiety Score  Nervous, Anxious, on Edge 0 3 1 0  Control/stop worrying 0 3 1 0  Worry too much - different things 0 3 1 0  Trouble relaxing 0 2 1 0  Restless 0 2 1 0  Easily annoyed or irritable 0 2 1 0  Afraid - awful might happen 0 2 0 0  Total GAD 7  Score 0 17 6 0  Anxiety Difficulty Not difficult at all Somewhat difficult Very difficult Not difficult at all       08/10/2022    3:02 PM 06/23/2022    4:30 PM 09/23/2021    9:42 AM  Depression screen PHQ 2/9  Decreased Interest 0 3 0  Down, Depressed, Hopeless 0 3 1  PHQ - 2 Score 0 6 1  Altered sleeping 0 3 1  Tired, decreased energy 0 3 0  Change in appetite 0 3 0  Feeling bad or failure about yourself  0 1 0  Trouble concentrating 0 1 0  Moving slowly or fidgety/restless 0 2 0  Suicidal thoughts 0 0 0  PHQ-9 Score 0 19 2  Difficult doing work/chores Not difficult at all Somewhat difficult Very difficult    BP Readings from Last 3 Encounters:  08/10/22 (!) 120/98  06/23/22 130/80  10/30/21 122/80    Physical Exam Vitals reviewed.  Constitutional:      Appearance: Normal appearance.  HENT:     Head: Normocephalic. Contusion present.     Comments: Right occiput    Right Ear: Tympanic membrane, ear canal and external ear normal. There is no impacted cerumen.     Left Ear: Tympanic membrane, ear canal and external ear normal. There is no impacted cerumen.     Nose: Nose normal.     Mouth/Throat:     Mouth: Mucous membranes are moist.  Eyes:     General:        Right eye: No discharge.        Left eye: No discharge.     Conjunctiva/sclera: Conjunctivae normal.  Neck:     Vascular: No carotid bruit.  Cardiovascular:     Rate and Rhythm: Normal rate.  Pulmonary:     Breath sounds: No wheezing, rhonchi or rales.  Musculoskeletal:     Cervical back: Normal range of motion. No rigidity or tenderness.     Lumbar back: Spasms present. No tenderness or bony tenderness. Positive right straight leg raise test and positive left straight leg raise test.  Lymphadenopathy:     Cervical: No cervical adenopathy.  Neurological:     Mental Status: He is alert.     Cranial Nerves: Cranial nerves 2-12 are intact.     Motor: No weakness.     Coordination: Romberg sign positive.      Wt Readings from Last 3 Encounters:  08/10/22 188 lb (85.3 kg)  06/23/22 193 lb (87.5 kg)  10/30/21 186 lb (84.4 kg)    BP (!) 120/98 (BP  Location: Left Arm, Cuff Size: Large)   Pulse 100   Ht 5\' 10"  (1.778 m)   Wt 188 lb (85.3 kg)   SpO2 98%   BMI 26.98 kg/m   Assessment and Plan: 1. Concussion without loss of consciousness, initial encounter New onset.  Initial evaluation after patient fell and hit his head on a piece of wood that was on the table.  Patient has had some localized head discomfort to the back of the head.  Patient states that he is baseline with his concentration.  There is been some balance issues but he has been having issues with this in the past.  There was no loss of consciousness.  Examination notes normal cranial nerves, decreased sensation ulnar nerves bilateral which patient says is baseline, and no loss of motor bilateral.  Patient is unable to maintain balance when close eyes and has a positive Romberg.  Patient has been told that he needs to go to the hospital for follow-up and likely CT of the head.  Patient refuses to go.  I have instructed that he may take Tylenol for headache with no other further analgesic and is to go to the hospital if symptoms persist or progress.  2. Balance disorder As noted above patient has some issues with balance and is having trouble with maintaining balance when walking.  I do not know if this is his baseline or if this is worsened since his fall.  Patient has been told that he should go to the hospital but refuses to do so.  3. Acute bilateral low back pain, unspecified whether sciatica present Patient had a twisting injury and the reason he came today is to get a referral back to Dr. Regenia Skeeter and orthopedics because of his back and previous surgery.  We have placed a referral for this and in the meantime patient is been told that given the above circumstances that Tylenol would be the preferred analgesic until he is properly  evaluated. - Ambulatory referral to Orthopedic Surgery   Patient also related to the nurse he is having difficulty with finding certain words which she has not had in the past and for this reason I have also told patient he needs to go to the hospital to be evaluated but he refuses to do so  Otilio Miu, MD

## 2022-08-11 ENCOUNTER — Encounter: Payer: Self-pay | Admitting: Family Medicine

## 2022-08-17 ENCOUNTER — Telehealth: Payer: Self-pay

## 2022-08-17 ENCOUNTER — Telehealth: Payer: Self-pay | Admitting: Family Medicine

## 2022-08-17 ENCOUNTER — Other Ambulatory Visit: Payer: Self-pay

## 2022-08-17 DIAGNOSIS — U099 Post covid-19 condition, unspecified: Secondary | ICD-10-CM

## 2022-08-17 NOTE — Telephone Encounter (Signed)
Copied from [place] [id number]. Topic: General - Other >> Aug 17, 2022  3:26 PM Denman George T wrote: Reason for CRM: Rosann Auerbach from Dr Cecilie Lowers Derian's office called sttd patient will be seen at their office on 09/07/22 at 2pm

## 2022-08-17 NOTE — Telephone Encounter (Signed)
Called pt and gave him the appt from [facility] at Dr derian's office- 09/07/2022 @ 2:00

## 2022-08-24 ENCOUNTER — Ambulatory Visit: Payer: No Typology Code available for payment source | Admitting: Family Medicine

## 2022-08-25 ENCOUNTER — Encounter: Payer: Self-pay | Admitting: Family Medicine

## 2022-09-14 ENCOUNTER — Other Ambulatory Visit: Payer: Self-pay | Admitting: Student

## 2022-09-14 DIAGNOSIS — R430 Anosmia: Secondary | ICD-10-CM

## 2022-09-28 ENCOUNTER — Other Ambulatory Visit: Payer: Self-pay | Admitting: Student

## 2022-09-28 DIAGNOSIS — Z77018 Contact with and (suspected) exposure to other hazardous metals: Secondary | ICD-10-CM

## 2022-10-01 ENCOUNTER — Encounter: Payer: Self-pay | Admitting: Student

## 2022-10-13 ENCOUNTER — Ambulatory Visit
Admission: RE | Admit: 2022-10-13 | Discharge: 2022-10-13 | Disposition: A | Discharge status: Home / Self Care | Payer: No Typology Code available for payment source | Source: Ambulatory Visit | Attending: Student

## 2022-10-13 DIAGNOSIS — R430 Anosmia: Secondary | ICD-10-CM

## 2022-10-13 MED ORDER — GADOPICLENOL 0.5 MMOL/ML IV SOLN
9.0000 mL | Freq: Once | INTRAVENOUS | Status: AC | PRN
  Administered 2022-10-13: 9 mL via INTRAVENOUS

## 2022-10-21 ENCOUNTER — Ambulatory Visit
Admission: EM | Admit: 2022-10-21 | Discharge: 2022-10-21 | Disposition: A | Discharge status: Home / Self Care | Payer: No Typology Code available for payment source | Attending: Physician Assistant | Admitting: Physician Assistant

## 2022-10-21 ENCOUNTER — Ambulatory Visit: Payer: No Typology Code available for payment source

## 2022-10-21 DIAGNOSIS — G8929 Other chronic pain: Secondary | ICD-10-CM

## 2022-10-21 DIAGNOSIS — M5442 Lumbago with sciatica, left side: Secondary | ICD-10-CM

## 2022-10-21 DIAGNOSIS — R29898 Other symptoms and signs involving the musculoskeletal system: Secondary | ICD-10-CM

## 2022-10-21 DIAGNOSIS — M25552 Pain in left hip: Secondary | ICD-10-CM

## 2022-10-21 DIAGNOSIS — W19XXXA Unspecified fall, initial encounter: Secondary | ICD-10-CM | POA: Diagnosis not present

## 2022-10-21 MED ORDER — KETOROLAC TROMETHAMINE 60 MG/2ML IM SOLN
30.0000 mg | Freq: Once | INTRAMUSCULAR | Status: AC
  Administered 2022-10-21: 30 mg via INTRAMUSCULAR

## 2022-10-21 MED ORDER — NAPROXEN 500 MG PO TABS: 500.0000 mg | ORAL_TABLET | Freq: Two times a day (BID) | ORAL | 0 refills | Status: AC

## 2022-10-21 MED ORDER — HYDROCODONE-ACETAMINOPHEN 7.5-325 MG PO TABS: 1.0000 | ORAL_TABLET | Freq: Four times a day (QID) | ORAL | 0 refills | Status: AC | PRN

## 2022-10-21 NOTE — Discharge Instructions (Signed)
-  The x-ray of your hip looks okay.  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 - Your symptoms are likely related to your chronic back problem. - Continue with the prednisone.  Is a naproxen to pharmacy.  I also sent Norco as needed for severe pain.  Try to perform the piriformis charge as able and change positions regularly. - Reach out to your neurologist.  Since you are having some weakness in the left hip you need to have a new MRI of your back. - If at any point you have another major fall or significantly worsening weakness/numbness, loss of bowel or bladder control then you need to go to the ER.

## 2022-10-21 NOTE — ED Triage Notes (Signed)
Pt presents to UC c/o LT hip pain after x2 falls in the last week. Pt states he feel like hip gives out on him and feels out of socket.

## 2022-10-21 NOTE — ED Provider Notes (Signed)
[facility]    CSN: [id number] Arrival date & time: 10/21/22  1055      History   Chief Complaint Chief Complaint  Patient presents with   Hip Pain    LT hip    HPI Derek Blake is a 62 y.o. male presenting for left-sided hip pain over the past week.  He reports the hip was hurting a little before he fell but has been hurting worse since.  He says it feels weak and like it is going to "give out."  He reports a lot of pain of the left buttocks radiating down the back of his thigh.  No radiating pain past the knee.  No numbness or tingling.  Patient says he always has pain in his back and his back is hurting but not any worse than normal.  He reports having history of 4 back surgeries.  Patient says he has not recently tried any OTC meds because "they do not work for me."  He reports he has a high pain tolerance and does not really like taking pain medication.  He says the pain feels different than when he has had pain related to his back.  He thinks it is "in the actual hip joint itself."  Pain is worse with flexing his back and any movements of his hip as well as raising his leg.  No other complaints.  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 Authorizing Provider  amLODipine (NORVASC) 5 MG tablet TAKE 1 TABLET(5 MG) BY MOUTH DAILY 06/23/22  Yes Duanne Limerick, MD  busPIRone (BUSPAR) 7.5 MG tablet Take 1 tablet (7.5 mg total) by mouth 2 (two) times daily. 06/23/22  Yes Duanne Limerick, MD  escitalopram (LEXAPRO) 20 MG tablet TAKE 1 TABLET BY MOUTH EVERY DAY 06/23/22  Yes Duanne Limerick, MD  HYDROcodone-acetaminophen (NORCO) 7.5-325 MG tablet Take 1 tablet by mouth every 6 (six) hours as needed for up to 5 days for moderate pain or severe pain. 10/21/22 10/26/22 Yes Eusebio Friendly B, PA-C  naproxen (NAPROSYN) 500 MG tablet Take 1 tablet (500 mg total) by mouth 2 (two) times daily. 10/21/22 11/20/22 Yes Shirlee Latch, PA-C    Family History Family History  Problem Relation Age of Onset   Cancer Mother     Social History Social History   Tobacco Use   Smoking status: Former   Smokeless tobacco: Never  Substance Use Topics   Alcohol use: Yes    Alcohol/week: 0.0 standard drinks of alcohol   Drug use: No  Allergies   Patient has no known allergies.   Review of Systems Review of Systems  Musculoskeletal:  Positive for arthralgias and back pain (chronic). Negative for gait problem.  Skin:  Negative for color change.  Neurological:  Positive for weakness (left hip). Negative for numbness.     Physical Exam Triage Vital Signs ED Triage Vitals  Enc Vitals Group     BP      Pulse      Resp      Temp      Temp src      SpO2      Weight      Height      Head Circumference      Peak Flow      Pain Score      Pain Loc      Pain Edu?      Excl. in GC?    No data found.  Updated Vital Signs BP (!) 149/88 (BP Location: Left Arm)   Pulse 82   Temp 98.2 F (36.8 C) (Oral)   Ht 5\' 10"  (1.778 m)   Wt 185 lb (83.9 kg)   SpO2 96%   BMI 26.54 kg/m       Physical Exam Vitals and nursing note reviewed.   Constitutional:      General: He is not in acute distress.    Appearance: He is well-developed.  HENT:     Head: Normocephalic and atraumatic.  Eyes:     Conjunctiva/sclera: Conjunctivae normal.  Cardiovascular:     Rate and Rhythm: Normal rate and regular rhythm.     Heart sounds: No murmur heard. Pulmonary:     Effort: Pulmonary effort is normal. No respiratory distress.     Breath sounds: Normal breath sounds.  Musculoskeletal:     Cervical back: Neck supple.     Lumbar back: Tenderness present. Decreased range of motion. Positive left straight leg raise test. Negative right straight leg raise test.     Left hip: Tenderness (lateral hip and buttocks) present. No deformity. Decreased range of motion.  Skin:    General: Skin is warm and dry.     Capillary Refill: Capillary refill takes less than 2 seconds.  Neurological:     General: No focal deficit present.     Mental Status: He is alert. Mental status is at baseline.     Motor: No weakness.     Gait: Gait normal.  Psychiatric:        Mood and Affect: Mood normal.        Behavior: Behavior normal.      UC Treatments / Results  Labs (all labs ordered are listed, but only abnormal results are displayed) Labs Reviewed - No data to display  EKG   Radiology DG Hip Unilat W or Wo Pelvis 2-3 Views Left  Result Date: 10/21/2022 CLINICAL DATA:  Larey Seat 2 weeks ago.  Persistent hip pain. EXAM: DG HIP (WITH OR WITHOUT PELVIS) 2-3V LEFT COMPARISON:  Lumbar spine 04/30/2015 FINDINGS: Surgical hardware at the lumbosacral junction. Pelvic bony ring is intact. Left hip is located without a fracture. Mild joint space narrowing in the hips. IMPRESSION: No acute bone abnormality to the pelvis or left hip. Electronically Signed   By: Richarda Overlie M.D.   On: 10/21/2022 13:16    Procedures Procedures (including critical care time)  Medications Ordered in UC Medications  ketorolac (TORADOL) injection 30 mg (30 mg Intramuscular Given  10/21/22 1312)  Initial Impression / Assessment and Plan / UC Course  I have reviewed the triage vital signs and the nursing notes.  Pertinent labs & imaging results that were available during my care of the patient were reviewed by me and considered in my medical decision making (see chart for details).   62 year old male with history of chronic low back pain presents for left-sided hip/mostly buttocks pain for the past 1 week.  Symptoms made worse after he fell onto the hip twice.  Pain radiates to the posterior thigh.  No numbness.  Does report weakness of the left hip.  Not currently taking any OTC meds.  No red flag signs or symptoms.  X-ray of left hip obtained.  X-ray of hip shows mild degenerative changes of hips.  No fractures or other acute abnormality.  Discussed these results with patient.  Patient's hip pain is likely related to radiculopathy from his chronic back issue.  He does have a neurologist who performed the previous surgeries on his back.  Last surgery was in 2016.  He has not had any recent MRIs.  Advised him that he needs to have a new MRI since he is reporting weakness in this hip.  Advised him to reach out to his neurologist ASAP to make an appointment.  He is currently on a 10-day taper of prednisone for loss of smell and taste over the past couple of years.  He was given 30 mg IM ketorolac in clinic for acute pain relief and I sent naproxen and Norco to pharmacy.  Reviewed supportive care at home.  Reviewed ED precautions.   Final Clinical Impressions(s) / UC Diagnoses   Final diagnoses:  Chronic low back pain with left-sided sciatica, unspecified back pain laterality  Left hip pain  Weakness of left hip  Fall, initial encounter     Discharge Instructions      -The x-ray of your hip looks okay.  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 - Your symptoms are likely related to your chronic back problem. - Continue with the prednisone.   Is a naproxen to pharmacy.  I also sent Norco as needed for severe pain.  Try to perform the piriformis charge as able and change positions regularly. - Reach out to your neurologist.  Since you are having some weakness in the left hip you need to have a new MRI of your back. - If at any point you have another major fall or significantly worsening weakness/numbness, loss of bowel or bladder control then you need to go to the ER.     ED Prescriptions     Medication Sig Dispense Auth. Provider   naproxen (NAPROSYN) 500 MG tablet Take 1 tablet (500 mg total) by mouth 2 (two) times daily. 60 tablet Shirlee Latch, PA-C   HYDROcodone-acetaminophen (NORCO) 7.5-325 MG tablet Take 1 tablet by mouth every 6 (six) hours as needed for up to 5 days for moderate pain or severe pain. 20 tablet Shirlee Latch, PA-C      I have reviewed the PDMP during this encounter.   Shirlee Latch, PA-C 10/21/22 1338

## 2022-10-29 ENCOUNTER — Other Ambulatory Visit: Payer: No Typology Code available for payment source

## 2022-11-01 IMAGING — CR DG LUMBAR SPINE 2-3V
3 series · 3 of 3 positions shown · non-contrast
Comparison: Lumbar radiograph 04/30/2015

CLINICAL DATA: Lumbar nerve root compression. Chronic pain, prior
surgery.

EXAM:
LUMBAR SPINE - 2-3 VIEW

[l-spine ap]
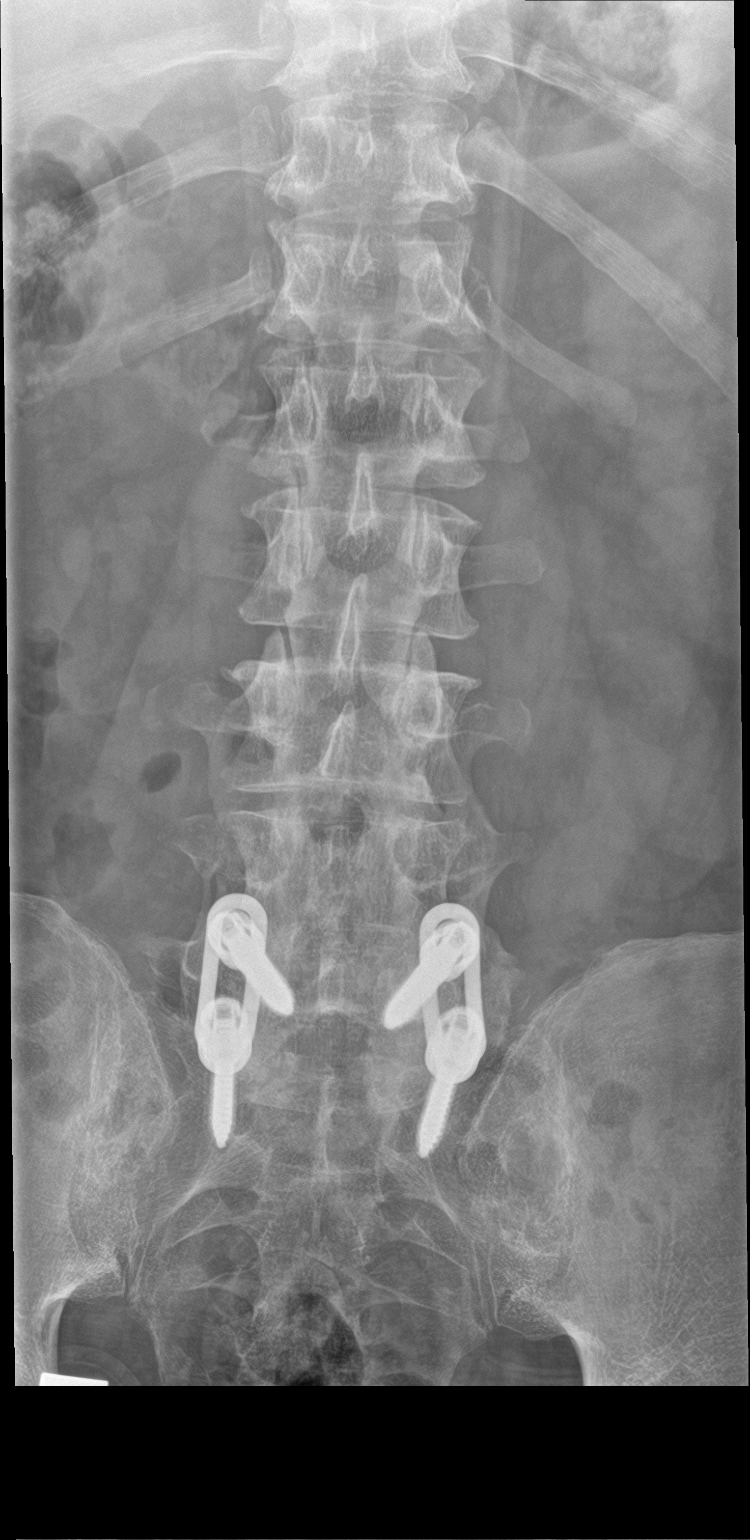

[l-spine lat]
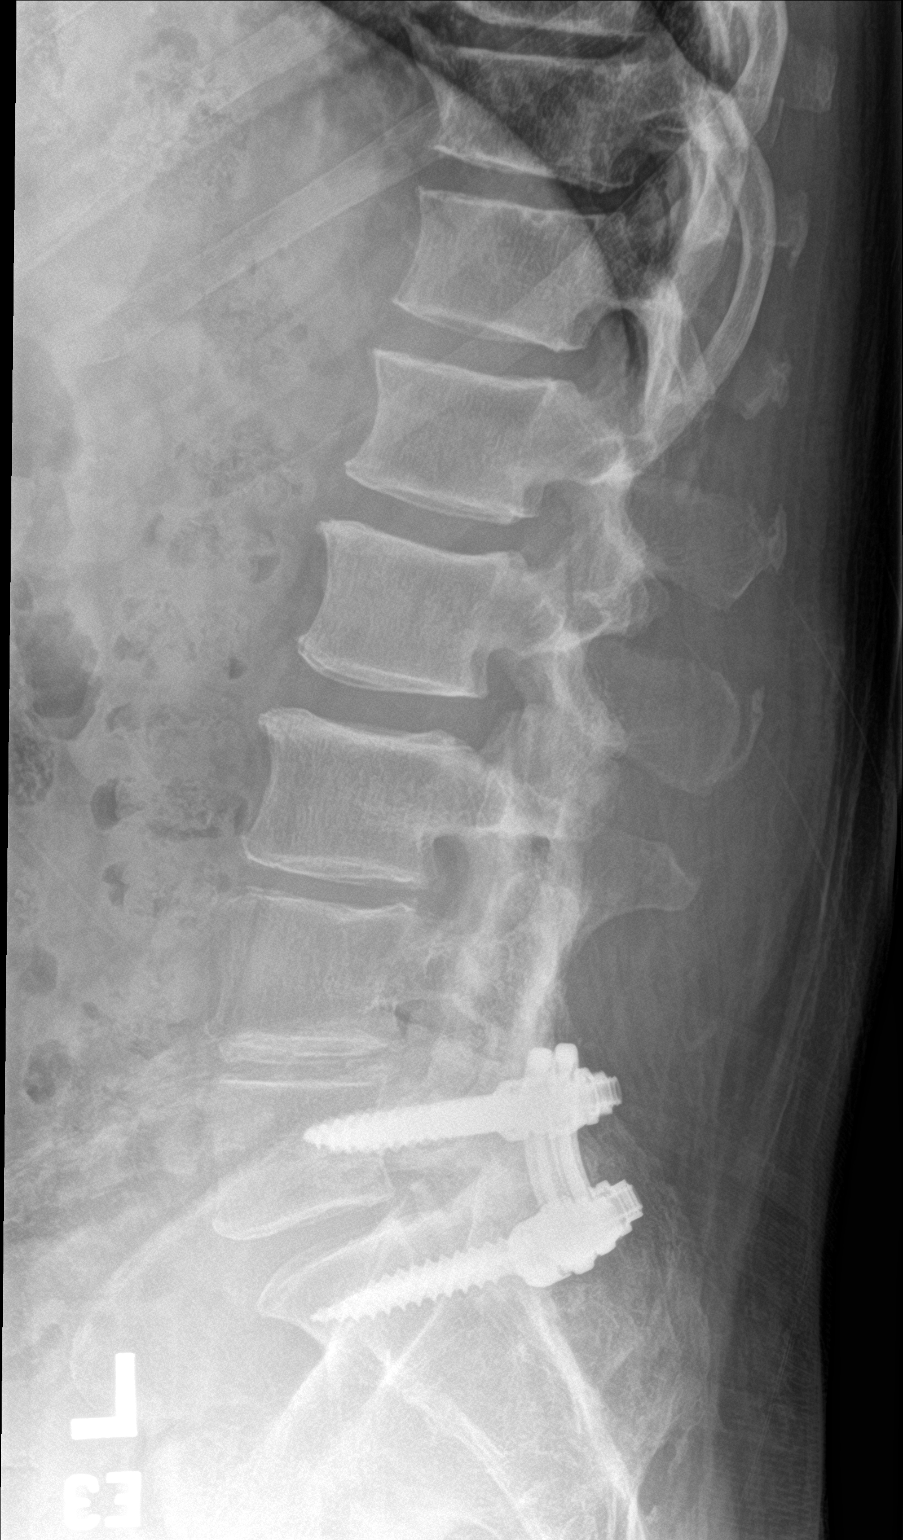

[l-spine spot]
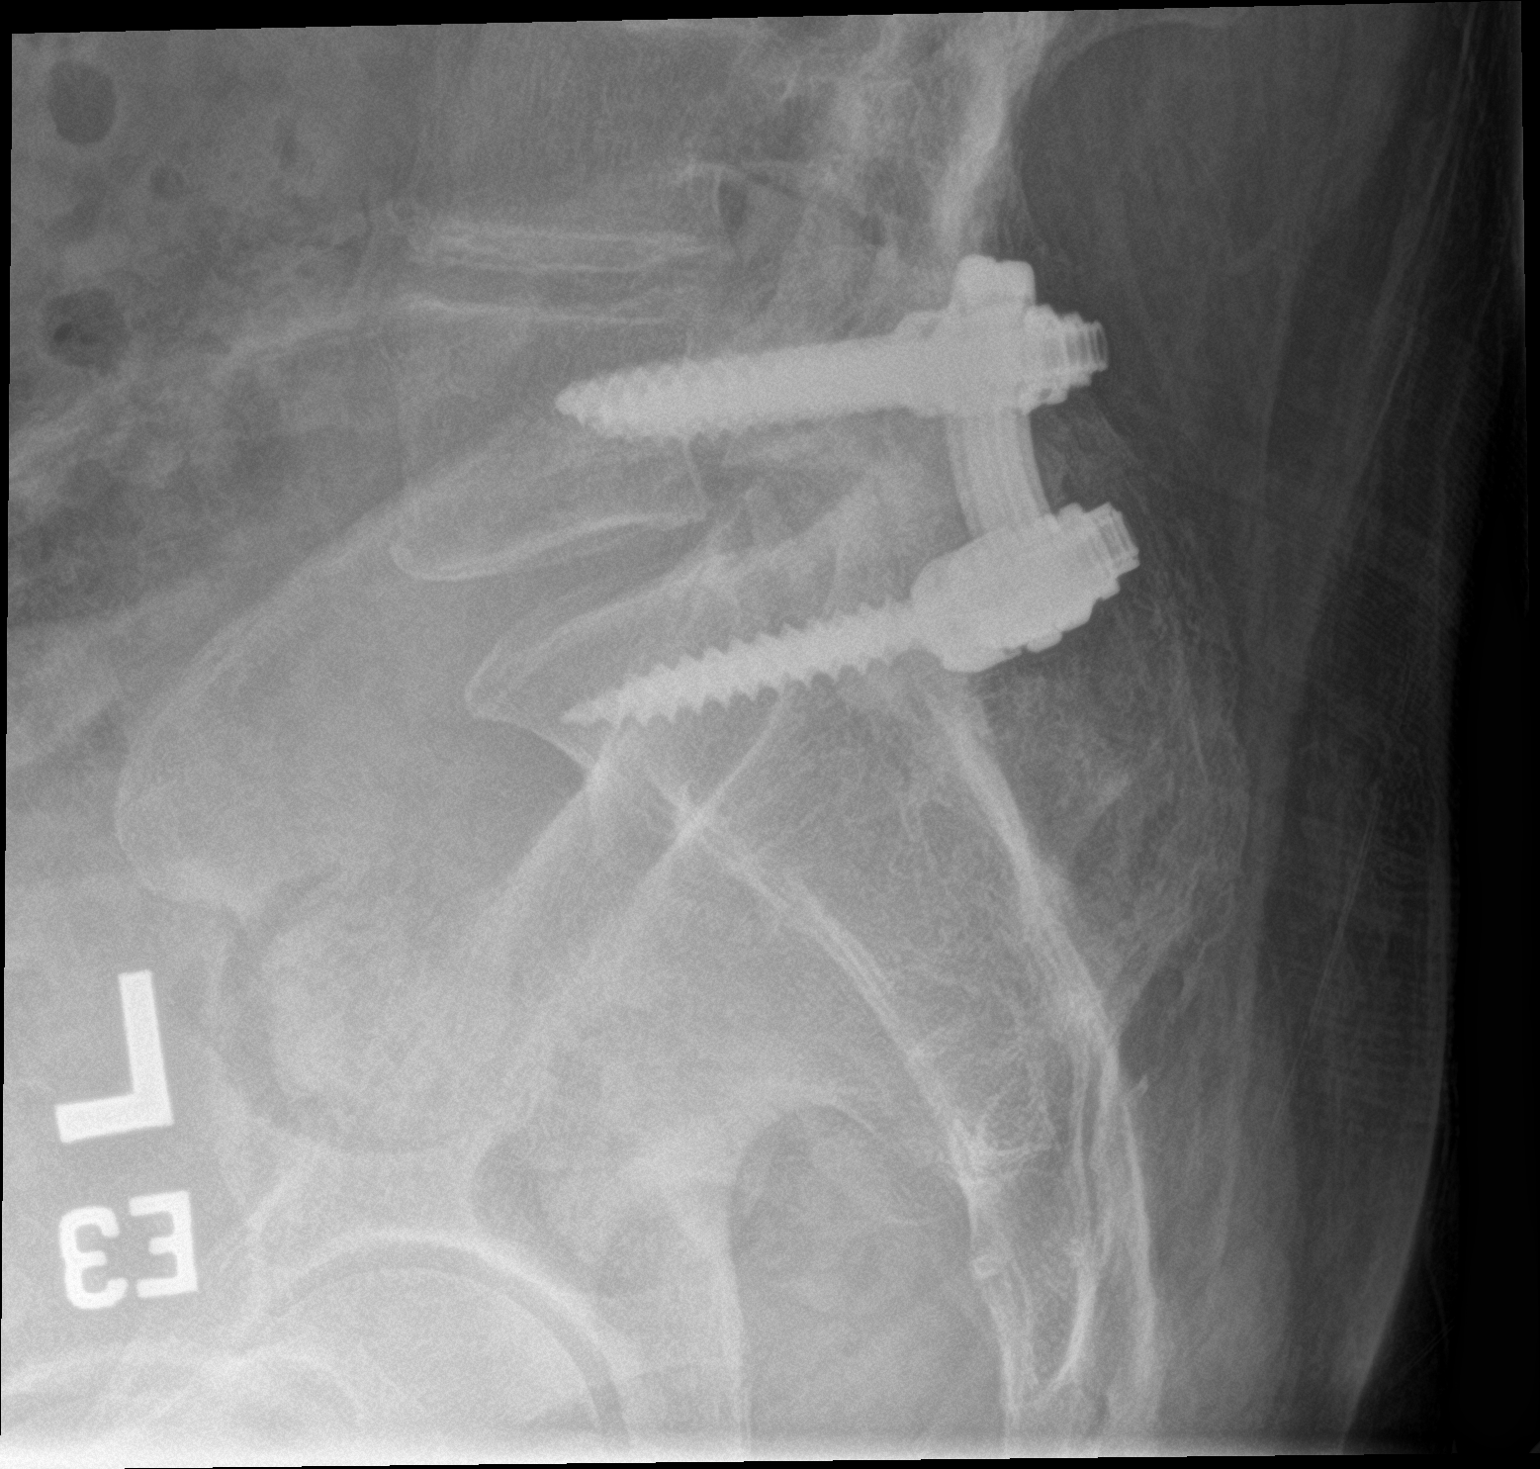

[3 of 3 positions shown; findings below may reference images not displayed]

FINDINGS: There are 5 non-rib-bearing lumbar vertebra. Posterior rod with
intrapedicular screws at L5-S1. The hardware is intact. Normal
alignment. Progressive disc space narrowing and endplate spurring at
L3-L4 and L4-L5 from prior exam. L4-L5 facet hypertrophy versus
postsurgical change. Vertebral body heights are normal. There is no
evidence of fracture or bony destruction.
IMPRESSION: 1. Posterior fusion hardware at L5-S1, hardware is intact.
2. Progressive degenerative disc disease at L3-L4 and L4-L5 from

## 2023-01-18 ENCOUNTER — Ambulatory Visit: Payer: No Typology Code available for payment source | Admitting: Family Medicine

## 2023-02-14 ENCOUNTER — Other Ambulatory Visit: Payer: Self-pay | Admitting: Family Medicine

## 2023-02-14 DIAGNOSIS — R030 Elevated blood-pressure reading, without diagnosis of hypertension: Secondary | ICD-10-CM

## 2023-02-22 ENCOUNTER — Ambulatory Visit: Payer: No Typology Code available for payment source | Admitting: Family Medicine

## 2023-02-23 ENCOUNTER — Ambulatory Visit (INDEPENDENT_AMBULATORY_CARE_PROVIDER_SITE_OTHER): Payer: Medicare Other | Admitting: Family Medicine

## 2023-02-23 ENCOUNTER — Encounter: Payer: Self-pay | Admitting: Family Medicine

## 2023-02-23 VITALS — BP 128/64 | HR 113 | Ht 70.0 in | Wt 186.0 lb

## 2023-02-23 DIAGNOSIS — F419 Anxiety disorder, unspecified: Secondary | ICD-10-CM | POA: Diagnosis not present

## 2023-02-23 DIAGNOSIS — F32A Depression, unspecified: Secondary | ICD-10-CM | POA: Diagnosis not present

## 2023-02-23 MED ORDER — BUSPIRONE HCL 5 MG PO TABS: 5.0000 mg | ORAL_TABLET | Freq: Two times a day (BID) | ORAL | 1 refills | Status: DC

## 2023-02-23 NOTE — Progress Notes (Signed)
Date:  02/23/2023   Name:  Derek Blake   DOB:  11-24-1960   MRN:  [id number]   Chief Complaint: Depression (Wants to discuss increasing Lexapro.) and Generalized Body Aches (Trouble walking for months now. Wants to be tested for M.S.)  Depression        This is a chronic problem.  The current episode started more than 1 year ago.   The onset quality is sudden.   The problem has been gradually improving since onset.  Associated symptoms include no decreased concentration, no fatigue, no helplessness, no hopelessness, does not have insomnia, not irritable, no restlessness, no decreased interest, no appetite change, no body aches, no myalgias, no headaches, no indigestion, not sad and no suicidal ideas.     The symptoms are aggravated by nothing.  Past treatments include SSRIs - Selective serotonin reuptake inhibitors.  Compliance with treatment is good.  Previous treatment provided mild relief.   Lab Results  Component Value Date   NA 138 06/23/2022   K 4.3 06/23/2022   CO2 22 06/23/2022   GLUCOSE 97 06/23/2022   BUN 11 06/23/2022   CREATININE 1.26 06/23/2022   CALCIUM 9.6 06/23/2022   EGFR 65 06/23/2022   GFRNONAA 68 10/30/2019   Lab Results  Component Value Date   CHOL 206 (H) 06/23/2022   HDL 47 06/23/2022   LDLCALC 129 (H) 06/23/2022   TRIG 171 (H) 06/23/2022   CHOLHDL 5.2 (H) 03/02/2016   Lab Results  Component Value Date   TSH 2.380 10/30/2019   Lab Results  Component Value Date   HGBA1C 5.5 10/30/2019   Lab Results  Component Value Date   WBC 7.3 10/30/2019   HGB 16.2 10/30/2019   HCT 46.9 10/30/2019   MCV 96 10/30/2019   PLT 279 10/30/2019   Lab Results  Component Value Date   ALT 12 06/23/2022   AST 16 06/23/2022   ALKPHOS 95 06/23/2022   BILITOT 0.6 06/23/2022   No results found for: "25OHVITD2", "25OHVITD3", "VD25OH"   Review of Systems  Constitutional:  Negative for appetite change, diaphoresis, fatigue and unexpected weight change.  HENT:   Negative for congestion.   Respiratory:  Negative for chest tightness, shortness of breath and stridor.   Cardiovascular:  Negative for chest pain, palpitations and leg swelling.  Gastrointestinal:  Negative for anal bleeding.  Musculoskeletal:  Negative for myalgias.  Neurological:  Negative for headaches.  Psychiatric/Behavioral:  Positive for depression. Negative for decreased concentration and suicidal ideas. The patient does not have insomnia.     Patient Active Problem List   Diagnosis Date Noted   Left lateral epicondylitis 05/08/2021   Medial epicondylitis of elbow, right 05/08/2021   Anxiety 02/14/2019   Overweight 07/18/2018   Cervical radiculopathy 10/28/2017   Postoperative anemia due to acute blood loss 03/06/2016   Anxiety and depression 07/12/2015   Bilateral sciatica 07/12/2015   Spinal stenosis of lumbar region with neurogenic claudication 07/12/2015   Status post lumbar spine surgery for decompression of spinal cord 07/12/2015   Lumbar nerve root compression 07/04/2015    No Known Allergies  Past Surgical History:  Procedure Laterality Date   BACK SURGERY     COLONOSCOPY  2016   cleared for 10 yrs- Dr Bluford Kaufmann   NASAL SEPTUM SURGERY     TONSILLECTOMY      Social History   Tobacco Use   Smoking status: Former   Smokeless tobacco: Never  Substance Use Topics   Alcohol use:  Yes    Alcohol/week: 0.0 standard drinks of alcohol   Drug use: No     Medication list has been reviewed and updated.  Current Meds  Medication Sig   amLODipine (NORVASC) 5 MG tablet TAKE 1 TABLET(5 MG) BY MOUTH DAILY   busPIRone (BUSPAR) 7.5 MG tablet Take 1 tablet (7.5 mg total) by mouth 2 (two) times daily.   escitalopram (LEXAPRO) 20 MG tablet TAKE 1 TABLET BY MOUTH EVERY DAY       02/23/2023    2:42 PM 08/10/2022    3:02 PM 06/23/2022    4:31 PM 09/23/2021    9:43 AM  GAD 7 : Generalized Anxiety Score  Nervous, Anxious, on Edge 3 0 3 1  Control/stop worrying 3 0 3 1  Worry  too much - different things 3 0 3 1  Trouble relaxing 2 0 2 1  Restless 2 0 2 1  Easily annoyed or irritable 2 0 2 1  Afraid - awful might happen 2 0 2 0  Total GAD 7 Score 17 0 17 6  Anxiety Difficulty Very difficult Not difficult at all Somewhat difficult Very difficult       02/23/2023    2:41 PM 08/10/2022    3:02 PM 06/23/2022    4:30 PM  Depression screen PHQ 2/9  Decreased Interest 2 0 3  Down, Depressed, Hopeless 2 0 3  PHQ - 2 Score 4 0 6  Altered sleeping 2 0 3  Tired, decreased energy 2 0 3  Change in appetite 2 0 3  Feeling bad or failure about yourself  2 0 1  Trouble concentrating 1 0 1  Moving slowly or fidgety/restless 2 0 2  Suicidal thoughts 0 0 0  PHQ-9 Score 15 0 19  Difficult doing work/chores Very difficult Not difficult at all Somewhat difficult    BP Readings from Last 3 Encounters:  02/23/23 128/64  10/21/22 (!) 149/88  08/10/22 (!) 120/98    Physical Exam Vitals and nursing note reviewed.  Constitutional:      General: He is not irritable. HENT:     Head: Normocephalic.     Right Ear: Tympanic membrane and external ear normal.     Left Ear: Tympanic membrane and external ear normal.     Nose: Nose normal.     Mouth/Throat:     Mouth: Mucous membranes are moist.  Eyes:     General: No scleral icterus.       Right eye: No discharge.        Left eye: No discharge.     Conjunctiva/sclera: Conjunctivae normal.     Pupils: Pupils are equal, round, and reactive to light.  Neck:     Thyroid: No thyromegaly.     Vascular: No JVD.     Trachea: No tracheal deviation.  Cardiovascular:     Rate and Rhythm: Normal rate and regular rhythm.     Heart sounds: Normal heart sounds. No murmur heard.    No friction rub. No gallop.  Pulmonary:     Effort: No respiratory distress.     Breath sounds: Normal breath sounds. No wheezing or rales.  Abdominal:     General: Bowel sounds are normal.     Palpations: Abdomen is soft. There is no mass.      Tenderness: There is no abdominal tenderness. There is no guarding or rebound.  Musculoskeletal:        General: No tenderness. Normal range of motion.  Cervical back: Normal range of motion and neck supple.  Lymphadenopathy:     Cervical: No cervical adenopathy.  Skin:    General: Skin is warm.     Findings: No erythema or rash.  Neurological:     Mental Status: He is alert and oriented to person, place, and time.     Cranial Nerves: No cranial nerve deficit.     Deep Tendon Reflexes: Reflexes are normal and symmetric.     Wt Readings from Last 3 Encounters:  02/23/23 186 lb (84.4 kg)  10/21/22 185 lb (83.9 kg)  08/10/22 188 lb (85.3 kg)    BP 128/64   Pulse (!) 113   Ht 5\' 10"  (1.778 m)   Wt 186 lb (84.4 kg)   SpO2 95%   BMI 26.69 kg/m   Assessment and Plan:  1. Anxiety and depression Chronic.  Uncontrolled.  Stable but with caution.  PHQ is 17 GAD score is 15.  Patient is currently on Lexapro at max dose and buspirone 7.5 mg twice a day.  Is going to be difficult to secure a psychiatry appointment and patient will be given information if there is no resolution with the increase in dosing.  We will keep Lexapro because at current dosing and not change to a different medication.  We can increase the buspirone 5 mg and we will increase by 5 mg twice a day along with the 7.5 for a total of 12.5 mg daily.  We will recheck patient in 4 weeks if not sooner if patient needs to come in sooner. - busPIRone (BUSPAR) 5 MG tablet; Take 1 tablet (5 mg total) by mouth 2 (two) times daily.  Dispense: 60 tablet; Refill: 1    Elizabeth Sauer, MD

## 2023-03-21 ENCOUNTER — Other Ambulatory Visit: Payer: Self-pay | Admitting: Family Medicine

## 2023-03-21 DIAGNOSIS — R030 Elevated blood-pressure reading, without diagnosis of hypertension: Secondary | ICD-10-CM

## 2023-06-17 ENCOUNTER — Encounter: Payer: Self-pay | Admitting: Family Medicine

## 2023-06-17 ENCOUNTER — Other Ambulatory Visit: Payer: Self-pay

## 2023-06-17 ENCOUNTER — Ambulatory Visit (INDEPENDENT_AMBULATORY_CARE_PROVIDER_SITE_OTHER): Payer: Medicare Other | Admitting: Family Medicine

## 2023-06-17 ENCOUNTER — Ambulatory Visit
Admission: RE | Admit: 2023-06-17 | Discharge: 2023-06-17 | Disposition: A | Discharge status: Home / Self Care | Payer: Medicare Other | Attending: Family Medicine | Admitting: Family Medicine

## 2023-06-17 ENCOUNTER — Ambulatory Visit
Admission: RE | Admit: 2023-06-17 | Discharge: 2023-06-17 | Disposition: A | Discharge status: Home / Self Care | Payer: Medicare Other | Source: Ambulatory Visit | Attending: Family Medicine | Admitting: Family Medicine

## 2023-06-17 VITALS — BP 110/68 | HR 89 | Ht 70.0 in | Wt 192.0 lb

## 2023-06-17 DIAGNOSIS — M25561 Pain in right knee: Secondary | ICD-10-CM | POA: Diagnosis not present

## 2023-06-17 DIAGNOSIS — W19XXXA Unspecified fall, initial encounter: Secondary | ICD-10-CM

## 2023-06-17 DIAGNOSIS — M1711 Unilateral primary osteoarthritis, right knee: Secondary | ICD-10-CM | POA: Diagnosis not present

## 2023-06-17 NOTE — Progress Notes (Signed)
Date:  06/17/2023   Name:  Derek Blake   DOB:  1961/04/25   MRN:  [id number]   Chief Complaint: Knee Pain (X 2 days, Right knee, fall, carpet, 8 pain scale, constant pain, tried tylenol did not help )  Knee Pain  The incident occurred more than 1 week ago. The injury mechanism was a fall. The pain is present in the right knee. The quality of the pain is described as aching. The pain is moderate. The pain has been Fluctuating since onset. Pertinent negatives include no inability to bear weight, loss of motion, loss of sensation, muscle weakness, numbness or tingling. The symptoms are aggravated by weight bearing and movement. He has tried acetaminophen for the symptoms. The treatment provided mild relief.    Lab Results  Component Value Date   NA 138 06/23/2022   K 4.3 06/23/2022   CO2 22 06/23/2022   GLUCOSE 97 06/23/2022   BUN 11 06/23/2022   CREATININE 1.26 06/23/2022   CALCIUM 9.6 06/23/2022   EGFR 65 06/23/2022   GFRNONAA 68 10/30/2019   Lab Results  Component Value Date   CHOL 206 (H) 06/23/2022   HDL 47 06/23/2022   LDLCALC 129 (H) 06/23/2022   TRIG 171 (H) 06/23/2022   CHOLHDL 5.2 (H) 03/02/2016   Lab Results  Component Value Date   TSH 2.380 10/30/2019   Lab Results  Component Value Date   HGBA1C 5.5 10/30/2019   Lab Results  Component Value Date   WBC 7.3 10/30/2019   HGB 16.2 10/30/2019   HCT 46.9 10/30/2019   MCV 96 10/30/2019   PLT 279 10/30/2019   Lab Results  Component Value Date   ALT 12 06/23/2022   AST 16 06/23/2022   ALKPHOS 95 06/23/2022   BILITOT 0.6 06/23/2022   No results found for: "25OHVITD2", "25OHVITD3", "VD25OH"   Review of Systems  Constitutional:  Positive for diaphoresis. Negative for chills, fatigue, fever and unexpected weight change.  HENT:  Negative for congestion, facial swelling, nosebleeds, postnasal drip, rhinorrhea, sinus pressure, sinus pain, sneezing, tinnitus and trouble swallowing.   Eyes:  Negative for  visual disturbance.  Respiratory:  Negative for apnea, shortness of breath, wheezing and stridor.   Cardiovascular:  Negative for chest pain, palpitations and leg swelling.  Gastrointestinal:  Negative for abdominal pain, blood in stool and constipation.  Endocrine: Negative for polydipsia and polyuria.  Musculoskeletal:  Positive for arthralgias and back pain. Negative for joint swelling, myalgias, neck pain and neck stiffness.  Skin:  Negative for rash.  Neurological:  Negative for tingling and numbness.    Patient Active Problem List   Diagnosis Date Noted   Left lateral epicondylitis 05/08/2021   Medial epicondylitis of elbow, right 05/08/2021   Anxiety 02/14/2019   Overweight 07/18/2018   Cervical radiculopathy 10/28/2017   Postoperative anemia due to acute blood loss 03/06/2016   Anxiety and depression 07/12/2015   Bilateral sciatica 07/12/2015   Spinal stenosis of lumbar region with neurogenic claudication 07/12/2015   Status post lumbar spine surgery for decompression of spinal cord 07/12/2015   Lumbar nerve root compression 07/04/2015    No Known Allergies  Past Surgical History:  Procedure Laterality Date   BACK SURGERY     COLONOSCOPY  2016   cleared for 10 yrs- Dr Bluford Kaufmann   NASAL SEPTUM SURGERY     TONSILLECTOMY      Social History   Tobacco Use   Smoking status: Former   Smokeless tobacco: Never  Substance Use Topics   Alcohol use: Yes    Alcohol/week: 0.0 standard drinks of alcohol   Drug use: No     Medication list has been reviewed and updated.  Current Meds  Medication Sig   amLODipine (NORVASC) 5 MG tablet TAKE 1 TABLET(5 MG) BY MOUTH DAILY   busPIRone (BUSPAR) 5 MG tablet Take 1 tablet (5 mg total) by mouth 2 (two) times daily.   busPIRone (BUSPAR) 7.5 MG tablet Take 1 tablet (7.5 mg total) by mouth 2 (two) times daily.   escitalopram (LEXAPRO) 20 MG tablet TAKE 1 TABLET BY MOUTH EVERY DAY       02/23/2023    2:42 PM 08/10/2022    3:02 PM  06/23/2022    4:31 PM 09/23/2021    9:43 AM  GAD 7 : Generalized Anxiety Score  Nervous, Anxious, on Edge 3 0 3 1  Control/stop worrying 3 0 3 1  Worry too much - different things 3 0 3 1  Trouble relaxing 2 0 2 1  Restless 2 0 2 1  Easily annoyed or irritable 2 0 2 1  Afraid - awful might happen 2 0 2 0  Total GAD 7 Score 17 0 17 6  Anxiety Difficulty Very difficult Not difficult at all Somewhat difficult Very difficult       02/23/2023    2:41 PM 08/10/2022    3:02 PM 06/23/2022    4:30 PM  Depression screen PHQ 2/9  Decreased Interest 2 0 3  Down, Depressed, Hopeless 2 0 3  PHQ - 2 Score 4 0 6  Altered sleeping 2 0 3  Tired, decreased energy 2 0 3  Change in appetite 2 0 3  Feeling bad or failure about yourself  2 0 1  Trouble concentrating 1 0 1  Moving slowly or fidgety/restless 2 0 2  Suicidal thoughts 0 0 0  PHQ-9 Score 15 0 19  Difficult doing work/chores Very difficult Not difficult at all Somewhat difficult    BP Readings from Last 3 Encounters:  06/17/23 110/68  02/23/23 128/64  10/21/22 (!) 149/88    Physical Exam Vitals and nursing note reviewed.  Constitutional:      Appearance: He is well-developed.  HENT:     Head: Normocephalic and atraumatic.     Right Ear: Tympanic membrane, ear canal and external ear normal.     Left Ear: Tympanic membrane, ear canal and external ear normal.     Nose: Nose normal.     Mouth/Throat:     Dentition: Normal dentition.  Eyes:     General: Lids are normal. No scleral icterus.    Conjunctiva/sclera: Conjunctivae normal.     Pupils: Pupils are equal, round, and reactive to light.  Neck:     Thyroid: No thyromegaly.     Vascular: No carotid bruit, hepatojugular reflux or JVD.     Trachea: No tracheal deviation.  Cardiovascular:     Rate and Rhythm: Normal rate and regular rhythm.     Heart sounds: Normal heart sounds. No murmur heard.    No friction rub. No gallop.  Pulmonary:     Effort: Pulmonary effort is  normal.     Breath sounds: Normal breath sounds. No wheezing, rhonchi or rales.  Abdominal:     General: Bowel sounds are normal.     Palpations: Abdomen is soft. There is no hepatomegaly, splenomegaly or mass.     Tenderness: There is no abdominal tenderness.  Hernia: There is no hernia in the left inguinal area.  Musculoskeletal:        General: Normal range of motion.     Cervical back: Normal range of motion and neck supple.  Lymphadenopathy:     Cervical: No cervical adenopathy.  Skin:    General: Skin is warm and dry.     Findings: No rash.  Neurological:     Mental Status: He is alert and oriented to person, place, and time.     Sensory: Sensation is intact. No sensory deficit.     Motor: Motor function is intact.     Deep Tendon Reflexes: Reflexes are normal and symmetric.     Reflex Scores:      Patellar reflexes are 2+ on the right side and 2+ on the left side. Psychiatric:        Mood and Affect: Mood is not anxious or depressed.     Wt Readings from Last 3 Encounters:  06/17/23 192 lb (87.1 kg)  02/23/23 186 lb (84.4 kg)  10/21/22 185 lb (83.9 kg)    BP 110/68   Pulse 89   Ht 5\' 10"  (1.778 m)   Wt 192 lb (87.1 kg)   SpO2 98%   BMI 27.55 kg/m   Assessment and Plan:  1. Arthralgia of right knee (Primary) New onset.  Persistent.  Patient is having issues with knee popping and feeling as if it may give way.  Examination notes tenderness along the lateral joint line of the right knee with no palpable meniscus on flexion extension of knee.  There is no swelling but I suspect there may have been some cartilage concern and we will use over-the-counter NSAIDs/Tylenol and make an appointment with sports medicine for evaluation.  Prior to such we will obtain an x-ray of the knee to have prior to appointment with sports medicine tomorrow.   Elizabeth Sauer, MD

## 2023-06-18 ENCOUNTER — Ambulatory Visit (INDEPENDENT_AMBULATORY_CARE_PROVIDER_SITE_OTHER): Payer: Medicare Other | Admitting: Family Medicine

## 2023-06-18 ENCOUNTER — Encounter: Payer: Self-pay | Admitting: Family Medicine

## 2023-06-18 VITALS — BP 130/92 | HR 103 | Ht 70.0 in | Wt 191.0 lb

## 2023-06-18 DIAGNOSIS — M48062 Spinal stenosis, lumbar region with neurogenic claudication: Secondary | ICD-10-CM

## 2023-06-18 DIAGNOSIS — Z9889 Other specified postprocedural states: Secondary | ICD-10-CM | POA: Diagnosis not present

## 2023-06-18 DIAGNOSIS — M1711 Unilateral primary osteoarthritis, right knee: Secondary | ICD-10-CM | POA: Diagnosis not present

## 2023-06-18 DIAGNOSIS — M25561 Pain in right knee: Secondary | ICD-10-CM | POA: Diagnosis not present

## 2023-06-18 MED ORDER — PREGABALIN 25 MG PO CAPS: 25.0000 mg | ORAL_CAPSULE | Freq: Every evening | ORAL | 0 refills | Status: DC

## 2023-06-18 NOTE — Assessment & Plan Note (Signed)
The patient also reports a recent fall that resulted in a right knee injury. The knee is causing significant discomfort, with the patient describing a constant popping sensation and inability to fully bend the knee. He also reports a sensation of heat in the knee and tightness in the tendons at the back of the knee.  Results RADIOLOGY Knee X-ray: No significant abnormalities (06/17/2023)  Knee Injury Recent fall resulted in knee pain and popping sensation. Limited range of motion and tenderness. -Apply Voltaren gel four times a day. -Use existing knee brace for support. -Consider use of rollator for stability.

## 2023-06-18 NOTE — Assessment & Plan Note (Addendum)
History of Present Illness The patient, with a history of multiple back surgeries, presents with increasing difficulty in leg mobility. He reports that his "legs are not responding to his brain's commands", leading to frequent falls. This issue has been progressively worsening, causing significant concern for the patient and his spouse. Of note, he has had his most recent spine surgery was 07/26/2018 as follows:  07/26/2018, the patient underwent uncomplicated bilateral lumbar decompression L3-sacrum, exploration of solid fusion masses/instrumentation removal L3-4 L4-5, adjacent level posterolateral lumbar spinal fusion with instrumentation L5-S1, excision bilateral synovial facet cyst L5-S1, right posterior iliac crest bone graft   In addition to these issues, the patient is experiencing numbness in his feet, which he describes as feeling like "leather pads." This numbness does not appear to be affecting his ability to walk, but it is a source of discomfort. The patient has an upcoming appointment with a neurologist to further investigate these issues. He was told that he can not follow-up with his spine surgeon due to missed appointments.  Assessment and Plan Lumbosacral neurogenic symptoms status post surgery Progressive weakness in legs leading to falls. No bowel or bladder dysfunction. Numbness in feet. -Referral to neurologist on March 3rd, 2025. -If symptoms worsen or new symptoms develop, patient advised to go to ER. -Start Lyrica 25 mg (pregabalin) at night. -Can escalate Lyrica

## 2023-06-23 NOTE — Progress Notes (Incomplete)
Primary Care / Sports Medicine Office Visit  Patient Information:  Patient ID: Derek Blake, male DOB: 03-31-1961 Age: 63 y.o. MRN: [id number]   Derek Blake is a pleasant 63 y.o. male presenting with the following:  Chief Complaint  Patient presents with   Back Pain    Chronic back pain, weakness, numbness, tingling in bil legs. Surgeries x 4. Patient taking IBU for pain. Updated MRI    Vitals:   06/18/23 1419  BP: (!) 130/92  Pulse: (!) 103  SpO2: 98%   Vitals:   06/18/23 1419  Weight: 191 lb (86.6 kg)  Height: 5\' 10"  (1.778 m)   Body mass index is 27.41 kg/m.  DG Knee Complete 4 Views Right Result Date: 06/17/2023 CLINICAL DATA:  Fall.  Right knee pain. EXAM: RIGHT KNEE - COMPLETE 4+ VIEW COMPARISON:  Right knee radiographs 01/24/2014 FINDINGS: Mild patellofemoral joint space narrowing. Mild superior patellar degenerative osteophytosis. No definite joint effusion. Mild peripheral medial elbow degenerative osteophytosis without significant joint space narrowing. The lateral compartment joint space is maintained. Superior patellar small subchondral degenerative cyst. No acute fracture or dislocation. IMPRESSION: Mild patellofemoral and medial compartment osteoarthritis. Electronically Signed   By: Neita Garnet M.D.   On: 06/17/2023 14:36     Independent interpretation of notes and tests performed by another provider:   None  Procedures performed:   None  Pertinent History, Exam, Impression, and Recommendations:   Problem List Items Addressed This Visit     Osteoarthritis of right knee - Primary   The patient also reports a recent fall that resulted in a right knee injury. The knee is causing significant discomfort, with the patient describing a constant popping sensation and inability to fully bend the knee. He also reports a sensation of heat in the knee and tightness in the tendons at the back of the knee.  Results RADIOLOGY Knee X-ray: No significant  abnormalities (06/17/2023)  Knee Injury Recent fall resulted in knee pain and popping sensation. Limited range of motion and tenderness. -Apply Voltaren gel four times a day. -Use existing knee brace for support. -Consider use of rollator for stability.      Spinal stenosis of lumbar region with neurogenic claudication   History of Present Illness The patient, with a history of multiple back surgeries, presents with increasing difficulty in leg mobility. He reports that his "legs are not responding to his brain's commands", leading to frequent falls. This issue has been progressively worsening, causing significant concern for the patient and his spouse. Of note, he has had his most recent spine surgery was 07/26/2018 as follows:  07/26/2018, the patient underwent uncomplicated bilateral lumbar decompression L3-sacrum, exploration of solid fusion masses/instrumentation removal L3-4 L4-5, adjacent level posterolateral lumbar spinal fusion with instrumentation L5-S1, excision bilateral synovial facet cyst L5-S1, right posterior iliac crest bone graft   In addition to these issues, the patient is experiencing numbness in his feet, which he describes as feeling like "leather pads." This numbness does not appear to be affecting his ability to walk, but it is a source of discomfort. The patient has an upcoming appointment with a neurologist to further investigate these issues. He was told that he can not follow-up with his spine surgeon due to missed appointments.  Assessment and Plan Lumbosacral neurogenic symptoms status post surgery Progressive weakness in legs leading to falls. No bowel or bladder dysfunction. Numbness in feet. -Referral to neurologist on March 3rd, 2025. -If symptoms worsen or new symptoms develop,  patient advised to go to ER. -Start Lyrica 25 mg (pregabalin) at night. -Can escalate Lyrica      Relevant Medications   pregabalin (LYRICA) 25 MG capsule   Status post lumbar spine  surgery for decompression of spinal cord     Orders & Medications Medications:  Meds ordered this encounter  Medications   pregabalin (LYRICA) 25 MG capsule    Sig: Take 1 capsule (25 mg total) by mouth every evening.    Dispense:  30 capsule    Refill:  0   No orders of the defined types were placed in this encounter.    No follow-ups on file.     Jerrol Banana, MD, [facility]   Primary Care Sports Medicine Primary Care and Sports Medicine at [facility]

## 2023-06-23 NOTE — Assessment & Plan Note (Deleted)
History of Present Illness The patient, with a history of multiple back surgeries, presents with increasing difficulty in leg mobility. He reports that his legs are "not responding to his brain's commands". This issue has been progressively worsening, causing significant concern for the patient and his spouse. The patient also reports a recent fall that resulted in a knee injury. See additional assessment(s) for plan details.  In addition to these issues, the patient is experiencing numbness in his feet, which he describes as feeling like "leather pads." This numbness does not appear to be affecting his ability to walk, but it is a source of discomfort. The patient has an upcoming appointment with a neurologist to further investigate these issues.  Assessment and Plan Post-surgical Spinal Complications Progressive weakness in legs leading to falls. No bowel or bladder dysfunction. Numbness in feet. -Referral to neurologist on March 3rd, 2025. -If symptoms worsen or new symptoms develop, patient advised to go to ER.  Knee Injury Recent fall resulted in knee pain and popping sensation. Limited range of motion and tenderness. -Apply Voltaren gel four times a day. -Use existing knee brace for support. -Consider use of rollator for stability.  Neuropathy Numbness in feet described as "leather pads". -Start Lyrica (pregabalin) at night. -Check response to Lyrica in one week.  Elbow No current issues, previous successful treatment with injections.  General Health Maintenance -Continue Metoprolol for hypertension. -Follow-up as needed.

## 2023-06-23 NOTE — Progress Notes (Signed)
Primary Care / Sports Medicine Office Visit  Patient Information:  Patient ID: Derek Blake, male DOB: 03-31-1961 Age: 63 y.o. MRN: [id number]   Derek Blake is a pleasant 63 y.o. male presenting with the following:  Chief Complaint  Patient presents with   Back Pain    Chronic back pain, weakness, numbness, tingling in bil legs. Surgeries x 4. Patient taking IBU for pain. Updated MRI    Vitals:   06/18/23 1419  BP: (!) 130/92  Pulse: (!) 103  SpO2: 98%   Vitals:   06/18/23 1419  Weight: 191 lb (86.6 kg)  Height: 5\' 10"  (1.778 m)   Body mass index is 27.41 kg/m.  DG Knee Complete 4 Views Right Result Date: 06/17/2023 CLINICAL DATA:  Fall.  Right knee pain. EXAM: RIGHT KNEE - COMPLETE 4+ VIEW COMPARISON:  Right knee radiographs 01/24/2014 FINDINGS: Mild patellofemoral joint space narrowing. Mild superior patellar degenerative osteophytosis. No definite joint effusion. Mild peripheral medial elbow degenerative osteophytosis without significant joint space narrowing. The lateral compartment joint space is maintained. Superior patellar small subchondral degenerative cyst. No acute fracture or dislocation. IMPRESSION: Mild patellofemoral and medial compartment osteoarthritis. Electronically Signed   By: Neita Garnet M.D.   On: 06/17/2023 14:36     Independent interpretation of notes and tests performed by another provider:   None  Procedures performed:   None  Pertinent History, Exam, Impression, and Recommendations:   Problem List Items Addressed This Visit     Osteoarthritis of right knee - Primary   The patient also reports a recent fall that resulted in a right knee injury. The knee is causing significant discomfort, with the patient describing a constant popping sensation and inability to fully bend the knee. He also reports a sensation of heat in the knee and tightness in the tendons at the back of the knee.  Results RADIOLOGY Knee X-ray: No significant  abnormalities (06/17/2023)  Knee Injury Recent fall resulted in knee pain and popping sensation. Limited range of motion and tenderness. -Apply Voltaren gel four times a day. -Use existing knee brace for support. -Consider use of rollator for stability.      Spinal stenosis of lumbar region with neurogenic claudication   History of Present Illness The patient, with a history of multiple back surgeries, presents with increasing difficulty in leg mobility. He reports that his "legs are not responding to his brain's commands", leading to frequent falls. This issue has been progressively worsening, causing significant concern for the patient and his spouse. Of note, he has had his most recent spine surgery was 07/26/2018 as follows:  07/26/2018, the patient underwent uncomplicated bilateral lumbar decompression L3-sacrum, exploration of solid fusion masses/instrumentation removal L3-4 L4-5, adjacent level posterolateral lumbar spinal fusion with instrumentation L5-S1, excision bilateral synovial facet cyst L5-S1, right posterior iliac crest bone graft   In addition to these issues, the patient is experiencing numbness in his feet, which he describes as feeling like "leather pads." This numbness does not appear to be affecting his ability to walk, but it is a source of discomfort. The patient has an upcoming appointment with a neurologist to further investigate these issues. He was told that he can not follow-up with his spine surgeon due to missed appointments.  Assessment and Plan Lumbosacral neurogenic symptoms status post surgery Progressive weakness in legs leading to falls. No bowel or bladder dysfunction. Numbness in feet. -Referral to neurologist on March 3rd, 2025. -If symptoms worsen or new symptoms develop,  patient advised to go to ER. -Start Lyrica 25 mg (pregabalin) at night. -Can escalate Lyrica      Relevant Medications   pregabalin (LYRICA) 25 MG capsule   Status post lumbar spine  surgery for decompression of spinal cord     Orders & Medications Medications:  Meds ordered this encounter  Medications   pregabalin (LYRICA) 25 MG capsule    Sig: Take 1 capsule (25 mg total) by mouth every evening.    Dispense:  30 capsule    Refill:  0   No orders of the defined types were placed in this encounter.    No follow-ups on file.     Jerrol Banana, MD, [facility]   Primary Care Sports Medicine Primary Care and Sports Medicine at [facility]

## 2023-06-24 NOTE — Patient Instructions (Signed)
Patient Plan:  1. Spine Pain:    - Referred to a neurologist for evaluation on July 26, 2023.    - Go to the emergency room if symptoms worsen or new symptoms develop.  2. Knee Injury:    - Apply Voltaren gel four times daily and use your knee brace.    - Consider using a rollator for stability.  3. Neuropathy:    - Started on Lyrica (pregabalin) to be taken at night.    - Check response to the medication in one week.

## 2023-07-26 DIAGNOSIS — R202 Paresthesia of skin: Secondary | ICD-10-CM | POA: Diagnosis not present

## 2023-07-26 DIAGNOSIS — F17220 Nicotine dependence, chewing tobacco, uncomplicated: Secondary | ICD-10-CM | POA: Diagnosis not present

## 2023-07-26 DIAGNOSIS — R2681 Unsteadiness on feet: Secondary | ICD-10-CM | POA: Diagnosis not present

## 2023-07-26 DIAGNOSIS — R296 Repeated falls: Secondary | ICD-10-CM | POA: Diagnosis not present

## 2023-07-26 DIAGNOSIS — Z23 Encounter for immunization: Secondary | ICD-10-CM | POA: Diagnosis not present

## 2023-07-26 DIAGNOSIS — R200 Anesthesia of skin: Secondary | ICD-10-CM | POA: Diagnosis not present

## 2023-08-13 ENCOUNTER — Ambulatory Visit
Admission: EM | Admit: 2023-08-13 | Discharge: 2023-08-13 | Disposition: A | Discharge status: Home / Self Care | Attending: Emergency Medicine | Admitting: Emergency Medicine

## 2023-08-13 ENCOUNTER — Encounter: Payer: Self-pay | Admitting: Emergency Medicine

## 2023-08-13 DIAGNOSIS — M62838 Other muscle spasm: Secondary | ICD-10-CM

## 2023-08-13 DIAGNOSIS — M545 Low back pain, unspecified: Secondary | ICD-10-CM | POA: Diagnosis not present

## 2023-08-13 DIAGNOSIS — G8929 Other chronic pain: Secondary | ICD-10-CM

## 2023-08-13 MED ORDER — IBUPROFEN 600 MG PO TABS: 600.0000 mg | ORAL_TABLET | Freq: Four times a day (QID) | ORAL | 0 refills | Status: DC | PRN

## 2023-08-13 MED ORDER — BACLOFEN 10 MG PO TABS: 10.0000 mg | ORAL_TABLET | Freq: Three times a day (TID) | ORAL | 0 refills | Status: DC

## 2023-08-13 MED ORDER — DIAZEPAM 5 MG PO TABS: 5.0000 mg | ORAL_TABLET | Freq: Every evening | ORAL | 0 refills | Status: AC

## 2023-08-13 NOTE — ED Triage Notes (Signed)
 Patient states that he fell yesterday.  Patient states that he landed on the carpet at home.  Patient reports neck pain and lower back pain.

## 2023-08-13 NOTE — ED Provider Notes (Signed)
 [facility]    CSN: [id number] Arrival date & time: 08/13/23  1736      History   Chief Complaint Chief Complaint  Patient presents with   Fall   Back Pain    HPI Derek Blake is a 63 y.o. male.   HPI  63 year old male with past medical history significant for anxiety, depression, cervical radiculopathy, bilateral sciatica, and chronic low back pain who walks with a cane presents for evaluation of low back pain on the right and left-sided neck pain after suffering a ground-level fall yesterday when he tripped.  He denies striking his head or impacting his neck on the ground.  He was able to catch himself with his left arm.  Past Medical History:  Diagnosis Date   Depression     Patient Active Problem List   Diagnosis Date Noted   Osteoarthritis of right knee 06/18/2023   Left lateral epicondylitis 05/08/2021   Medial epicondylitis of elbow, right 05/08/2021   Anxiety 02/14/2019   Overweight 07/18/2018   Cervical radiculopathy 10/28/2017   Postoperative anemia due to acute blood loss 03/06/2016   Anxiety and depression 07/12/2015   Bilateral sciatica 07/12/2015   Spinal stenosis of lumbar region with neurogenic claudication 07/12/2015   Status post lumbar spine surgery for decompression of spinal cord 07/12/2015   Lumbar nerve root compression 07/04/2015    Past Surgical History:  Procedure Laterality Date   BACK SURGERY     COLONOSCOPY  2016   cleared for 10 yrs- Dr Bluford Kaufmann   NASAL SEPTUM SURGERY     TONSILLECTOMY         Home Medications    Prior to Admission medications   Medication Sig Start Date End Date Taking? Authorizing Provider  baclofen (LIORESAL) 10 MG tablet Take 1 tablet (10 mg total) by mouth 3 (three) times daily. 08/13/23  Yes Becky Augusta, NP  diazepam (VALIUM) 5 MG tablet Take 1 tablet (5 mg total) by mouth at bedtime for 5 days. 08/13/23 08/18/23 Yes Becky Augusta, NP  ibuprofen (ADVIL) 600 MG tablet Take 1 tablet (600 mg total)  by mouth every 6 (six) hours as needed. 08/13/23  Yes Becky Augusta, NP  amLODipine (NORVASC) 5 MG tablet TAKE 1 TABLET(5 MG) BY MOUTH DAILY 03/22/23   Duanne Limerick, MD  busPIRone (BUSPAR) 5 MG tablet Take 1 tablet (5 mg total) by mouth 2 (two) times daily. 02/23/23   Duanne Limerick, MD  busPIRone (BUSPAR) 7.5 MG tablet Take 1 tablet (7.5 mg total) by mouth 2 (two) times daily. 06/23/22   Duanne Limerick, MD  escitalopram (LEXAPRO) 20 MG tablet TAKE 1 TABLET BY MOUTH EVERY DAY 06/23/22   Duanne Limerick, MD  pregabalin (LYRICA) 25 MG capsule Take 1 capsule (25 mg total) by mouth every evening. 06/18/23   Jerrol Banana, MD    Family History Family History  Problem Relation Age of Onset   Cancer Mother     Social History Social History   Tobacco Use   Smoking status: Former   Smokeless tobacco: Never  Vaping Use   Vaping status: Never Used  Substance Use Topics   Alcohol use: Yes    Alcohol/week: 0.0 standard drinks of alcohol   Drug use: No     Allergies   Patient has no known allergies.   Review of Systems Review of Systems  Musculoskeletal:  Positive for back pain and neck pain.  Neurological:  Negative for weakness and numbness.  Physical Exam Triage Vital Signs ED Triage Vitals [08/13/23 1855]  Encounter Vitals Group     BP      Systolic BP Percentile      Diastolic BP Percentile      Pulse      Resp      Temp      Temp src      SpO2      Weight 190 lb 14.7 oz (86.6 kg)     Height 5\' 10"  (1.778 m)     Head Circumference      Peak Flow      Pain Score 10     Pain Loc      Pain Education      Exclude from Growth Chart    No data found.  Updated Vital Signs BP (!) 162/99 (BP Location: Left Arm)   Pulse 94   Temp 97.7 F (36.5 C) (Oral)   Resp 15   Ht 5\' 10"  (1.778 m)   Wt 190 lb 14.7 oz (86.6 kg)   SpO2 95%   BMI 27.39 kg/m   Visual Acuity Right Eye Distance:   Left Eye Distance:   Bilateral Distance:    Right Eye Near:   Left Eye  Near:    Bilateral Near:     Physical Exam Vitals and nursing note reviewed.  Constitutional:      Appearance: Normal appearance. He is not ill-appearing.  Musculoskeletal:        General: No tenderness.  Skin:    General: Skin is warm and dry.     Capillary Refill: Capillary refill takes less than 2 seconds.     Findings: No bruising or erythema.  Neurological:     General: No focal deficit present.     Mental Status: He is alert and oriented to person, place, and time.      UC Treatments / Results  Labs (all labs ordered are listed, but only abnormal results are displayed) Labs Reviewed - No data to display  EKG   Radiology No results found.  Procedures Procedures (including critical care time)  Medications Ordered in UC Medications - No data to display  Initial Impression / Assessment and Plan / UC Course  I have reviewed the triage vital signs and the nursing notes.  Pertinent labs & imaging results that were available during my care of the patient were reviewed by me and considered in my medical decision making (see chart for details).   Patient is a pleasant, nontoxic-appearing 63 year old male presenting for evaluation of left-sided neck pain and right low back pain as outlined HPI above.  He has no midline spinous process tenderness or step-off in his cervical, thoracic, or lumbar spine.  He does have significant muscle tension and spasm in the upper trapezius muscle on the left-hand side and in his right lower lumbar paraspinous region.  I do not feel it necessary to image the patient today as he did not impact his spine and he has no bony tenderness.  I suspect his injuries are all soft tissue in nature.  I will discharge him home on ibuprofen 600 milligrams every 6 hours with food and baclofen 10 mg every 8 hours to help with inflammation and muscle pain.  I will also prescribe a low-dose Valium tablet that he can take at bedtime to help with muscle spasm.  Home  physical therapy exercises reviewed with patient.  Has no open scripts with PDMP.   Final Clinical Impressions(s) /  UC Diagnoses   Final diagnoses:  Neck muscle spasm  Chronic right-sided low back pain without sciatica     Discharge Instructions      Take the ibuprofen, 600 mg every 6 hours with food, on a schedule for the next 48 hours and then as needed.  Take the baclofen, 10 mg every 8 hours, on a schedule for the next 48 hours and then as needed.  Apply moist heat to your neck/back for 30 minutes at a time 2-3 times a day to improve blood flow to the area and help remove the lactic acid causing the spasm.  Follow the neck/back exercises given at discharge.  Take the diazepam at bedtime only.  This will help you sleep and is also a potent muscle relaxer.  Return for reevaluation for any new or worsening symptoms.      ED Prescriptions     Medication Sig Dispense Auth. Provider   ibuprofen (ADVIL) 600 MG tablet Take 1 tablet (600 mg total) by mouth every 6 (six) hours as needed. 30 tablet Becky Augusta, NP   baclofen (LIORESAL) 10 MG tablet Take 1 tablet (10 mg total) by mouth 3 (three) times daily. 30 each Becky Augusta, NP   diazepam (VALIUM) 5 MG tablet Take 1 tablet (5 mg total) by mouth at bedtime for 5 days. 5 tablet Becky Augusta, NP      I have reviewed the PDMP during this encounter.   Becky Augusta, NP 08/13/23 [place]

## 2023-08-13 NOTE — Discharge Instructions (Addendum)
 Take the ibuprofen, 600 mg every 6 hours with food, on a schedule for the next 48 hours and then as needed.  Take the baclofen, 10 mg every 8 hours, on a schedule for the next 48 hours and then as needed.  Apply moist heat to your neck/back for 30 minutes at a time 2-3 times a day to improve blood flow to the area and help remove the lactic acid causing the spasm.  Follow the neck/back exercises given at discharge.  Take the diazepam at bedtime only.  This will help you sleep and is also a potent muscle relaxer.  Return for reevaluation for any new or worsening symptoms.

## 2023-09-17 DIAGNOSIS — R296 Repeated falls: Secondary | ICD-10-CM | POA: Diagnosis not present

## 2023-09-17 DIAGNOSIS — R2681 Unsteadiness on feet: Secondary | ICD-10-CM | POA: Diagnosis not present

## 2023-09-17 DIAGNOSIS — R200 Anesthesia of skin: Secondary | ICD-10-CM | POA: Diagnosis not present

## 2023-09-17 DIAGNOSIS — R202 Paresthesia of skin: Secondary | ICD-10-CM | POA: Diagnosis not present

## 2023-10-08 ENCOUNTER — Other Ambulatory Visit: Payer: Self-pay | Admitting: Family Medicine

## 2023-10-08 ENCOUNTER — Other Ambulatory Visit: Payer: Self-pay

## 2023-10-08 DIAGNOSIS — F32A Depression, unspecified: Secondary | ICD-10-CM

## 2023-10-08 DIAGNOSIS — R030 Elevated blood-pressure reading, without diagnosis of hypertension: Secondary | ICD-10-CM

## 2023-10-08 NOTE — Telephone Encounter (Signed)
 Copied from CRM [id number]. Topic: Clinical - Medication Refill >> Oct 08, 2023  1:41 PM Yolanda T wrote: Medication:  amLODipine  (NORVASC ) 5 MG tablet  busPIRone  (BUSPAR ) 5 MG tablet  escitalopram  (LEXAPRO ) 20 MG tablet  Has the patient contacted their pharmacy? No  (Agent: If yes, when and what did the pharmacy advise?)  This is the patient's preferred pharmacy:  [id number], [place] - [address] AT [facility] [address] [place] [address] [place] [address] [place] [address] Phone: [phone] Fax: [phone]  Is this the correct pharmacy for this prescription? Yes  Has the prescription been filled recently? Yes  Is the patient out of the medication? Yes  Has the patient been seen for an appointment in the last year OR does the patient have an upcoming appointment? Yes  Can we respond through MyChart? No  Agent: Please be advised that Rx refills may take up to 3 business days. We ask that you follow-up with your pharmacy.

## 2023-10-11 ENCOUNTER — Other Ambulatory Visit: Payer: Self-pay

## 2023-10-11 DIAGNOSIS — F32A Depression, unspecified: Secondary | ICD-10-CM

## 2023-10-12 ENCOUNTER — Telehealth: Payer: Self-pay | Admitting: Family Medicine

## 2023-10-12 NOTE — Telephone Encounter (Signed)
 Please refill if appropriate

## 2023-10-12 NOTE — Telephone Encounter (Signed)
 Please review and sign if appropriate.

## 2023-10-12 NOTE — Telephone Encounter (Signed)
 Please see below  Copied from CRM [id number]. Topic: Clinical - Medication Refill >> Oct 12, 2023  3:14 PM Lizabeth Riggs wrote: [facility] did not receive the order for: amLODipine  (NORVASC ) 5 MG tablet  escitalopram  (LEXAPRO ) 20 MG tablet Please send order to refill to [facility]. He is out of both medications. Please call Ron is there is any problem or concern. Thanks

## 2023-10-13 ENCOUNTER — Telehealth: Payer: Self-pay | Admitting: Family Medicine

## 2023-10-13 NOTE — Telephone Encounter (Signed)
 Copied from CRM [id number]. Topic: Medicare AWV >> Oct 13, 2023  2:08 PM Juliana Ocean wrote: Reason for CRM: LVM 10/13/2023 to schedule AWV. Please schedule Virtual or Telehealth visits ONLY  Rosalee Collins; Care Guide Ambulatory Clinical Support [place] [facility] Direct Dial: [phone]

## 2023-10-14 ENCOUNTER — Other Ambulatory Visit: Payer: Self-pay

## 2023-10-14 DIAGNOSIS — R030 Elevated blood-pressure reading, without diagnosis of hypertension: Secondary | ICD-10-CM

## 2023-10-14 DIAGNOSIS — F32A Depression, unspecified: Secondary | ICD-10-CM

## 2023-10-14 MED ORDER — AMLODIPINE BESYLATE 5 MG PO TABS: ORAL_TABLET | ORAL | 0 refills | Status: DC

## 2023-10-14 MED ORDER — ESCITALOPRAM OXALATE 20 MG PO TABS: ORAL_TABLET | ORAL | 1 refills | Status: DC

## 2023-10-14 NOTE — Telephone Encounter (Signed)
 Completed. JM

## 2023-10-14 NOTE — Telephone Encounter (Signed)
 The patient called in to check on the status of his refill request and after speaking with Jessenia she had me relay the call to further assist him.

## 2023-10-22 ENCOUNTER — Other Ambulatory Visit: Payer: Self-pay | Admitting: Internal Medicine

## 2023-10-22 MED ORDER — AMLODIPINE BESYLATE 5 MG PO TABS: ORAL_TABLET | ORAL | 0 refills | Status: AC

## 2023-10-22 MED ORDER — BUSPIRONE HCL 5 MG PO TABS: 5.0000 mg | ORAL_TABLET | Freq: Two times a day (BID) | ORAL | 0 refills | Status: DC

## 2023-10-22 MED ORDER — ESCITALOPRAM OXALATE 20 MG PO TABS
ORAL_TABLET | ORAL | 0 refills | Status: AC
Start: 2023-10-22 — End: ?

## 2023-10-22 NOTE — Progress Notes (Unsigned)
 Date:  10/22/2023   Name:  Derek Blake   DOB:  05/31/1960   MRN:  [id number]   Chief Complaint: No chief complaint on file.  HPI  Review of Systems   Lab Results  Component Value Date   NA 138 06/23/2022   K 4.3 06/23/2022   CO2 22 06/23/2022   GLUCOSE 97 06/23/2022   BUN 11 06/23/2022   CREATININE 1.26 06/23/2022   CALCIUM 9.6 06/23/2022   EGFR 65 06/23/2022   GFRNONAA 68 10/30/2019   Lab Results  Component Value Date   CHOL 206 (H) 06/23/2022   HDL 47 06/23/2022   LDLCALC 129 (H) 06/23/2022   TRIG 171 (H) 06/23/2022   CHOLHDL 5.2 (H) 03/02/2016   Lab Results  Component Value Date   TSH 2.380 10/30/2019   Lab Results  Component Value Date   HGBA1C 5.5 10/30/2019   Lab Results  Component Value Date   WBC 7.3 10/30/2019   HGB 16.2 10/30/2019   HCT 46.9 10/30/2019   MCV 96 10/30/2019   PLT 279 10/30/2019   Lab Results  Component Value Date   ALT 12 06/23/2022   AST 16 06/23/2022   ALKPHOS 95 06/23/2022   BILITOT 0.6 06/23/2022   No results found for: "25OHVITD2", "25OHVITD3", "VD25OH"   Patient Active Problem List   Diagnosis Date Noted   Osteoarthritis of right knee 06/18/2023   Left lateral epicondylitis 05/08/2021   Medial epicondylitis of elbow, right 05/08/2021   Anxiety 02/14/2019   Overweight 07/18/2018   Cervical radiculopathy 10/28/2017   Postoperative anemia due to acute blood loss 03/06/2016   Anxiety and depression 07/12/2015   Bilateral sciatica 07/12/2015   Spinal stenosis of lumbar region with neurogenic claudication 07/12/2015   Status post lumbar spine surgery for decompression of spinal cord 07/12/2015   Lumbar nerve root compression 07/04/2015    No Known Allergies  Past Surgical History:  Procedure Laterality Date   BACK SURGERY     COLONOSCOPY  2016   cleared for 10 yrs- Dr Janine Melbourne   NASAL SEPTUM SURGERY     TONSILLECTOMY      Social History   Tobacco Use   Smoking status: Former   Smokeless tobacco: Never   Vaping Use   Vaping status: Never Used  Substance Use Topics   Alcohol use: Yes    Alcohol/week: 0.0 standard drinks of alcohol   Drug use: No     Medication list has been reviewed and updated.  No outpatient medications have been marked as taking for the 10/22/23 encounter (Orders Only) with Sheron Dixons, MD.       02/23/2023    2:42 PM 08/10/2022    3:02 PM 06/23/2022    4:31 PM 09/23/2021    9:43 AM  GAD 7 : Generalized Anxiety Score  Nervous, Anxious, on Edge 3 0 3 1  Control/stop worrying 3 0 3 1  Worry too much - different things 3 0 3 1  Trouble relaxing 2 0 2 1  Restless 2 0 2 1  Easily annoyed or irritable 2 0 2 1  Afraid - awful might happen 2 0 2 0  Total GAD 7 Score 17 0 17 6  Anxiety Difficulty Very difficult Not difficult at all Somewhat difficult Very difficult       02/23/2023    2:41 PM 08/10/2022    3:02 PM 06/23/2022    4:30 PM  Depression screen PHQ 2/9  Decreased Interest 2 0  3  Down, Depressed, Hopeless 2 0 3  PHQ - 2 Score 4 0 6  Altered sleeping 2 0 3  Tired, decreased energy 2 0 3  Change in appetite 2 0 3  Feeling bad or failure about yourself  2 0 1  Trouble concentrating 1 0 1  Moving slowly or fidgety/restless 2 0 2  Suicidal thoughts 0 0 0  PHQ-9 Score 15 0 19  Difficult doing work/chores Very difficult Not difficult at all Somewhat difficult    BP Readings from Last 3 Encounters:  08/13/23 (!) 162/99  06/18/23 (!) 130/92  06/17/23 110/68    Physical Exam  Wt Readings from Last 3 Encounters:  08/13/23 190 lb 14.7 oz (86.6 kg)  06/18/23 191 lb (86.6 kg)  06/17/23 192 lb (87.1 kg)    There were no vitals taken for this visit.  Assessment and Plan:  Problem List Items Addressed This Visit   None   No follow-ups on file.    Sheron Dixons, MD [facility] Primary Care and Sports Medicine [place]

## 2023-11-08 ENCOUNTER — Ambulatory Visit: Admitting: Physician Assistant

## 2023-11-08 ENCOUNTER — Encounter: Payer: Self-pay | Admitting: Family Medicine

## 2023-11-16 ENCOUNTER — Other Ambulatory Visit: Payer: Self-pay

## 2023-11-16 DIAGNOSIS — F32A Depression, unspecified: Secondary | ICD-10-CM

## 2023-11-16 MED ORDER — BUSPIRONE HCL 5 MG PO TABS
5.0000 mg | ORAL_TABLET | Freq: Two times a day (BID) | ORAL | 0 refills | Status: AC
Start: 2023-11-16 — End: ?

## 2023-12-10 ENCOUNTER — Ambulatory Visit
Admission: EM | Admit: 2023-12-10 | Discharge: 2023-12-10 | Disposition: A | Discharge status: Home / Self Care | Attending: Emergency Medicine | Admitting: Emergency Medicine

## 2023-12-10 DIAGNOSIS — S143XXA Injury of brachial plexus, initial encounter: Secondary | ICD-10-CM | POA: Diagnosis not present

## 2023-12-10 MED ORDER — DEXAMETHASONE SODIUM PHOSPHATE 10 MG/ML IJ SOLN
10.0000 mg | Freq: Once | INTRAMUSCULAR | Status: AC
  Administered 2023-12-10: 10 mg via INTRAMUSCULAR

## 2023-12-10 MED ORDER — BACLOFEN 10 MG PO TABS: 10.0000 mg | ORAL_TABLET | Freq: Three times a day (TID) | ORAL | 0 refills | Status: AC

## 2023-12-10 MED ORDER — METHYLPREDNISOLONE 4 MG PO TBPK: ORAL_TABLET | ORAL | 0 refills | Status: AC

## 2023-12-10 NOTE — ED Triage Notes (Signed)
 Pt c/o Fall x2days  [place]  Pt has had 4 back surgeries and has neuropathy and is unsure if he has Guillain-Barr syndrome or parkinson's.

## 2023-12-10 NOTE — Discharge Instructions (Addendum)
 Begin the Medrol Dosepak tomorrow morning to help decrease inflammation in the inflamed nerves in your left shoulder, back, and neck.  Follow the package instructions for dosing.  You may begin using the baclofen  today.  This will help decrease muscle tension which should help alleviate pressure on the nerve and decrease that burning sensation you are feeling.  Follow the home physical therapy exercises provided at discharge to help alleviate your symptoms in your left neck, upper back, and shoulder.  If your pain does not improve within 48 hours on steroids, or if it precipitously worsens, I recommend following up with your spine specialist at [facility].  Their [place] office at [facility] has an urgent care that is open 7 days a week and they have all imaging modalities available to them.

## 2023-12-10 NOTE — ED Provider Notes (Signed)
 [facility]    CSN: [id number] Arrival date & time: 12/10/23  9178      History   Chief Complaint Chief Complaint  Patient presents with   Fall    HPI Derek Blake is a 63 y.o. male.   HPI  63 year old male with past medical history significant for cervical radiculopathy status post multilevel cervical fusion, anxiety, depression, bilateral sciatica, lumbar spinal stenosis with neurogenic claudication, status post lumbar spine decompression surgery, and osteoarthritis of the right knee presents for evaluation of left neck and shoulder pain after suffering a ground-level fall 2 days ago.  He is complaining of a burning sensation in his upper back into his shoulder and numbness in the left arm.  He also reports that he feels like his grip is slightly weak in the left arm.  Patient does ambulate with a cane.  Past Medical History:  Diagnosis Date   Depression     Patient Active Problem List   Diagnosis Date Noted   Osteoarthritis of right knee 06/18/2023   Anxiety 02/14/2019   Overweight 07/18/2018   Cervical radiculopathy 10/28/2017   Anxiety and depression 07/12/2015   Bilateral sciatica 07/12/2015   Spinal stenosis of lumbar region with neurogenic claudication 07/12/2015   Status post lumbar spine surgery for decompression of spinal cord 07/12/2015    Past Surgical History:  Procedure Laterality Date   BACK SURGERY     COLONOSCOPY  2016   cleared for 10 yrs- Dr Ora   NASAL SEPTUM SURGERY     TONSILLECTOMY         Home Medications    Prior to Admission medications   Medication Sig Start Date End Date Taking? Authorizing Provider  amLODipine  (NORVASC ) 5 MG tablet TAKE 1 TABLET(5 MG) BY MOUTH DAILY 10/22/23  Yes Berglund, Laura H, MD  baclofen  (LIORESAL ) 10 MG tablet Take 1 tablet (10 mg total) by mouth 3 (three) times daily. 12/10/23  Yes Bernardino Ditch, NP  busPIRone  (BUSPAR ) 5 MG tablet Take 1 tablet (5 mg total) by mouth 2 (two) times daily.  11/16/23  Yes Kotturi, Vinay K, MD  escitalopram  (LEXAPRO ) 20 MG tablet TAKE 1 TABLET BY MOUTH EVERY DAY 10/22/23  Yes Berglund, Laura H, MD  methylPREDNISolone (MEDROL DOSEPAK) 4 MG TBPK tablet Take according to the package insert. 12/10/23  Yes Bernardino Ditch, NP    Family History Family History  Problem Relation Age of Onset   Cancer Mother     Social History Social History   Tobacco Use   Smoking status: Former   Smokeless tobacco: Never  Vaping Use   Vaping status: Never Used  Substance Use Topics   Alcohol use: Yes    Alcohol/week: 0.0 standard drinks of alcohol   Drug use: No     Allergies   Patient has no known allergies.   Review of Systems Review of Systems  Musculoskeletal:  Positive for arthralgias, myalgias and neck pain. Negative for joint swelling and neck stiffness.  Neurological:  Positive for weakness and numbness.     Physical Exam Triage Vital Signs ED Triage Vitals  Encounter Vitals Group     BP 12/10/23 0851 (!) 114/100     Girls Systolic BP Percentile --      Girls Diastolic BP Percentile --      Boys Systolic BP Percentile --      Boys Diastolic BP Percentile --      Pulse Rate 12/10/23 0851 (!) 118     Resp --  Temp 12/10/23 0851 98.4 F (36.9 C)     Temp Source 12/10/23 0851 Oral     SpO2 12/10/23 0851 97 %     Weight 12/10/23 0851 188 lb (85.3 kg)     Height 12/10/23 0851 5' 10 (1.778 m)     Head Circumference --      Peak Flow --      Pain Score 12/10/23 0850 10     Pain Loc --      Pain Education --      Exclude from Growth Chart --    No data found.  Updated Vital Signs BP (!) 114/100 (BP Location: Left Arm)   Pulse (!) 118   Temp 98.4 F (36.9 C) (Oral)   Ht 5' 10 (1.778 m)   Wt 188 lb (85.3 kg)   SpO2 97%   BMI 26.98 kg/m   Visual Acuity Right Eye Distance:   Left Eye Distance:   Bilateral Distance:    Right Eye Near:   Left Eye Near:    Bilateral Near:     Physical Exam Vitals and nursing note  reviewed.  Constitutional:      Appearance: Normal appearance. He is not ill-appearing.  HENT:     Head: Normocephalic and atraumatic.  Musculoskeletal:        General: Tenderness and signs of injury present. No swelling. Normal range of motion.  Skin:    General: Skin is warm and dry.     Capillary Refill: Capillary refill takes less than 2 seconds.     Findings: No erythema.  Neurological:     General: No focal deficit present.     Mental Status: He is alert and oriented to person, place, and time.     Sensory: No sensory deficit.     Motor: No weakness.      UC Treatments / Results  Labs (all labs ordered are listed, but only abnormal results are displayed) Labs Reviewed - No data to display  EKG   Radiology No results found.  Procedures Procedures (including critical care time)  Medications Ordered in UC Medications - No data to display  Initial Impression / Assessment and Plan / UC Course  I have reviewed the triage vital signs and the nursing notes.  Pertinent labs & imaging results that were available during my care of the patient were reviewed by me and considered in my medical decision making (see chart for details).   Patient is a nontoxic-appearing 63 year old male presenting for evaluation of left-sided neck and shoulder pain as outlined in the HPI above.  The patient reports that he [place] and his leg did not move when he went to walk forward causing him to fall.  He did not strike his head or neck but he did catch himself completely on his left arm jarring his left shoulder, neck, and back.  He has a history of multilevel degenerative changes in his spine as well as cervical fusion and lumbar decompression surgery.  He is currently going to [facility] to be evaluated for for his ambulation issues.  In the exam room he is not in any acute distress and he is moving all extremities independently.  He has no midline spinous process  tenderness or step-off in his cervical or thoracic spine.  At the level of T3-T4 he does have left paraspinous point tenderness with muscle tension.  His bilateral grips are 5/5 in bilateral upper extremity  strength is 5/5.  DTRs are 2+.  I suspect that the patient has sustained a brachial plexus injury given the burning nature of the pain and numbness he is experiencing in his hand as he has no motor deficits.  I we will have staff administer 10 mg of IM Decadron  here in clinic and discharge him home on a Medrol Dosepak with baclofen  to help with the muscle strain.  I will also give the patient home physical therapy exercises to perform.  I have advised the patient that if he does not improve within 48 hours on the steroids, or if he precipitously becomes worse, that he needs to follow-up with his spine specialist.  His cervical surgery was performed by [facility] and advised him that he can go to their [facility] clinic as they have all imaging modalities there.   Final Clinical Impressions(s) / UC Diagnoses   Final diagnoses:  Brachial plexus injury, left, initial encounter     Discharge Instructions      Begin the Medrol Dosepak tomorrow morning to help decrease inflammation in the inflamed nerves in your left shoulder, back, and neck.  Follow the package instructions for dosing.  You may begin using the baclofen  today.  This will help decrease muscle tension which should help alleviate pressure on the nerve and decrease that burning sensation you are feeling.  Follow the home physical therapy exercises provided at discharge to help alleviate your symptoms in your left neck, upper back, and shoulder.  If your pain does not improve within 48 hours on steroids, or if it precipitously worsens, I recommend following up with your spine specialist at [facility].  Their [place] office at [facility] has an urgent care that is open 7 days a week and they have all imaging modalities available to  them.     ED Prescriptions     Medication Sig Dispense Auth. Provider   methylPREDNISolone (MEDROL DOSEPAK) 4 MG TBPK tablet Take according to the package insert. 1 each Bernardino Ditch, NP   baclofen  (LIORESAL ) 10 MG tablet Take 1 tablet (10 mg total) by mouth 3 (three) times daily. 30 each Bernardino Ditch, NP      PDMP not reviewed this encounter.   Bernardino Ditch, NP 12/10/23 [phone]

## 2024-06-01 NOTE — Progress Notes (Signed)
 " FAM[facility], [facility] [address] [place] [address] Operated by [facility]     NAME: Derek Blake DOS: 06/01/2024  MRN: [id number] DOB: 08-15-1960   Chief Complaint: New Patient   History of Present Illness: Derek Blake is a 64 y.o. male who comes in today to establish care.  Patient's last PCP was Dr. Joshua in [place] .  Patient was last seen in April of 2025.  Patient states he has a history of 4 lumbar spine surgeries in 1 surgery on his cervical spine.  Patient states he has degenerative disc disease of both the lumbar and cervical spine.  Patient also states he has spinal stenosis of the lumbar spine.  Patient was seeing Neurology.  Patient states he was ordered MRIs in 2025 in [place]  which he did have done but we do not have the records.  Patient states his back pain was so bad that he went to ER at [facility] 4 weeks ago and was sent home with Suboxone for the pain.  Medical History   I have reviewed and updated as appropriate the past medical, surgical, family, and social history today: Medical History/Surgical History/Family History/Social History Past Medical History:  Diagnosis Date   Anxiety    Carpal tunnel syndrome    Depression    History of back surgery    Neuropathy (CMS HCC)      Past Surgical History:  Procedure Laterality Date   HX BACK SURGERY     NECK SURGERY     Family Medical History:     Problem Relation (Age of Onset)   Cancer Mother, Father, Maternal Aunt, Maternal Uncle   Heart Attack Father        Social History   Socioeconomic History   Marital status: Widowed  Tobacco Use   Smoking status: Former    Types: Cigarettes   Smokeless tobacco: Never  Vaping Use   Vaping status: Never Used  Substance and Sexual Activity   Alcohol use: Not Currently   Drug use: Not Currently   Sexual activity: Not Currently   Social Determinants of Health   Financial Resource  Strain: Medium Risk (01/25/2024)   Received from [facility]   Overall Financial Resource Strain (CARDIA)    Difficulty of Paying Living Expenses: Somewhat hard  Transportation Needs: No Transportation Needs (01/25/2024)   Received from [facility]    In the past 12 months, has lack of transportation kept you from medical appointments or from getting medications?: No    Lack of Transportation (Non-Medical): No  Housing Stability: Low Risk  (01/25/2024)   Received from [facility]   Housing Stability Vital Sign    In the last 12 months, was there a time when you were not able to pay the mortgage or rent on time?: No    In the past 12 months, how many times have you moved where you were living?: 0    At any time in the past 12 months, were you homeless or living in a shelter (including now)?: No    Allergies: Allergies[1]  Medications: Current Outpatient Medications  Medication Sig   amLODIPine  (NORVASC ) 5 mg Oral Tablet Take 1 Tablet (5 mg total) by mouth Daily   escitalopram  oxalate (LEXAPRO ) 20 mg Oral Tablet Take 1 Tablet (20 mg total) by mouth Daily    Problem List: There are no active problems to display for this patient.  Review of Systems: GENERAL:  No weight change, change in appetite, thirst, fever, or chills  HEENT: No headache or blurred vision  CARDIOPULMONARY: No chest pain, palpitations, or shortness of breath  GASTROINTESTINAL:  No diarrhea, constipation, nausea, vomiting  GENITOURINAY:  No dysuria  HEMATOLOGY/ONCOLOGY:  No pallor, bruising or bleeding  INTEGUMENTARY:  No rash, ulcers, lesions  MUSCULOSKELETAL: No change in strength, No swelling c/o lower and cervical back pain NEUROLOGIC: No headache or loss of consciousness  PSYCHIATRIC: No change in personality, affect     Objective  Vitals: BP 139/89   Pulse 80   Temp 36.4 C (97.6 F)   Resp 18   Ht 1.778 m (5' 10)    Wt 88 kg (194 lb)   SpO2 97%   BMI 27.84 kg/m     Examination:  GENERAL APPEARANCE:  No acute distress, alert and conversant  HEAD:  Normocephalic, atraumatic  EYES:  PERRL, EOMI  EARS:  Hearing grossly intact  NOSE:  Nares patent, no discharge noted  ORAL CAVITY:  Mucosa moist  NECK/THYROID:  Neck supple, trachea midline, no masses or thyromegaly  SKIN:  Warm, no rashes or ulcers  HEART:  RRR, S1, S2 normal  LUNGS:  CTA  ABDOMEN: Bowel sounds present, soft, non-tender  MUSCULOSKELETAL: limping gait, ambulates with cane  EXTREMITIES:  No lower edema  PERIPHERAL PULSES: + 2 pedal pulses  NEUROLOGIC:  Nonfocal, speech clear  PSYCH: Alert, oriented, cooperative, appropriate mood and affect, normal judgement .  Assessment/Plan  Diagnosis/Plan:   ICD-10-CM   1. Encounter for medical examination to establish care  Z00.00 THYROID STIMULATING HORMONE WITH FREE T4 REFLEX    PSA SCREENING    CBC    COMPREHENSIVE METABOLIC PNL, FASTING    LIPID PANEL    2. Essential (primary) hypertension  I10 amLODIPine  (NORVASC ) 5 mg Oral Tablet    CBC    COMPREHENSIVE METABOLIC PNL, FASTING    LIPID PANEL    3. Anxiety  F41.9 escitalopram  oxalate (LEXAPRO ) 20 mg Oral Tablet    4. DDD (degenerative disc disease), cervical  M50.30     5. DDD (degenerative disc disease), lumbar  M51.369     6. Screening for thyroid disorder  Z13.29 THYROID STIMULATING HORMONE WITH FREE T4 REFLEX    7. Screening PSA (prostate specific antigen)  Z12.5 PSA SCREENING      Plan: 1. Encounter for medical examination to establish care (Primary) - THYROID STIMULATING HORMONE WITH FREE T4 REFLEX; Future - PSA SCREENING; Future - CBC; Future - COMPREHENSIVE METABOLIC PNL, FASTING; Future - LIPID PANEL; Future  2. Essential (primary) hypertension Chronic/stable.  Refill sent for amlodipine  - amLODIPine  (NORVASC ) 5 mg Oral Tablet; Take 1 Tablet (5 mg total) by mouth Daily  Dispense: 90 Tablet; Refill: 3 -  CBC; Future - COMPREHENSIVE METABOLIC PNL, FASTING; Future - LIPID PANEL; Future  3. Anxiety Chronic/stable.  Controlled with Lexapro  refills sent to pharmacy - escitalopram  oxalate (LEXAPRO ) 20 mg Oral Tablet; Take 1 Tablet (20 mg total) by mouth Daily  Dispense: 90 Tablet; Refill: 3  4. DDD (degenerative disc disease), cervical We will obtain records and most recent MRI  5. DDD (degenerative disc disease), lumbar We will obtain records and most recent MRI  6. Screening for thyroid disorder - THYROID STIMULATING HORMONE WITH FREE T4 REFLEX; Future  7. Screening PSA (prostate specific antigen) - PSA SCREENING; Future  Discussed with patient that he will need to be referred to pain management but will need records prior  to referral.  Discussed that family medicine does not treat chronic pain.  Patient voiced understanding.  Will obtain records.  Refills sent to pharmacy.  Will follow up with patient in 4 weeks.    Encounter Medications and Orders Orders Placed This Encounter   THYROID STIMULATING HORMONE WITH FREE T4 REFLEX   PSA SCREENING   CBC   COMPREHENSIVE METABOLIC PNL, FASTING   LIPID PANEL   amLODIPine  (NORVASC ) 5 mg Oral Tablet   escitalopram  oxalate (LEXAPRO ) 20 mg Oral Tablet    Discussed with patient any new diagnosis and /or medications/treatments.  Answered all patient questions and explained lab and diagnostic results, new and continued medications.  Encouraged patient to take medications as prescribed and keep all follow-up appointments.  Discussed with patient to return to office, call or use the portal to contact provider with any issues, concerns, questions, or scheduling.  RTO with any issues.  If office is closed go to a [facility] or the local ED if it is an emergency.   Return in about 4 weeks (around 06/29/2024) for 4 week follow up.  On the day of the encounter, a total of 45 minutes was spent on this patient encounter including review of historical  information, examination, documentation and post visit activities.   Amy Spurrier, FNP-BC  This note was partially created using M*Modal fluency direct system (voice recognition software ) and is inherently subject to errors including those of syntax and sound- alike substitutions which may escape proofreading.  In such instances, original meaning may be extrapolated by contextual derivation.        [1] No Known Allergies "

## 2024-06-01 NOTE — Nursing Note (Signed)
 PHQ Questionnaire  Little interest or pleasure in doing things.: Not at all  Feeling down, depressed, or hopeless: Not at all  PHQ 2 Total: 0
